# Patient Record
Sex: Female | Born: 1985 | Race: Black or African American | Hispanic: No | Marital: Married | Smoking: Never smoker
Health system: Southern US, Community
[De-identification: ages and names within clinical notes are randomized; demographics above are authoritative.]

## PROBLEM LIST (undated history)

## (undated) DIAGNOSIS — Z9109 Other allergy status, other than to drugs and biological substances: Secondary | ICD-10-CM

## (undated) DIAGNOSIS — D649 Anemia, unspecified: Secondary | ICD-10-CM

## (undated) DIAGNOSIS — T8040XA Rh incompatibility reaction due to transfusion of blood or blood products, unspecified, initial encounter: Secondary | ICD-10-CM

## (undated) DIAGNOSIS — O139 Gestational [pregnancy-induced] hypertension without significant proteinuria, unspecified trimester: Secondary | ICD-10-CM

## (undated) DIAGNOSIS — I8390 Asymptomatic varicose veins of unspecified lower extremity: Secondary | ICD-10-CM

## (undated) DIAGNOSIS — Z3182 Encounter for Rh incompatibility status: Secondary | ICD-10-CM

## (undated) HISTORY — DX: Rh incompatibility reaction due to transfusion of blood or blood products, unspecified, initial encounter: T80.40XA

## (undated) HISTORY — DX: Asymptomatic varicose veins of unspecified lower extremity: I83.90

## (undated) HISTORY — DX: Encounter for Rh incompatibility status: Z31.82

---
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
## 2005-02-26 ENCOUNTER — Ambulatory Visit: Payer: Self-pay | Admitting: Family Medicine

## 2005-03-11 ENCOUNTER — Ambulatory Visit: Payer: Self-pay | Admitting: Family Medicine

## 2005-03-22 ENCOUNTER — Encounter (INDEPENDENT_AMBULATORY_CARE_PROVIDER_SITE_OTHER): Payer: Self-pay | Admitting: *Deleted

## 2005-03-24 ENCOUNTER — Encounter (INDEPENDENT_AMBULATORY_CARE_PROVIDER_SITE_OTHER): Payer: Self-pay | Admitting: Family Medicine

## 2005-03-26 ENCOUNTER — Ambulatory Visit: Payer: Self-pay | Admitting: Family Medicine

## 2005-03-26 ENCOUNTER — Encounter (INDEPENDENT_AMBULATORY_CARE_PROVIDER_SITE_OTHER): Payer: Self-pay | Admitting: Family Medicine

## 2005-03-26 ENCOUNTER — Other Ambulatory Visit: Admission: RE | Admit: 2005-03-26 | Discharge: 2005-03-26 | Payer: Self-pay | Admitting: Family Medicine

## 2005-04-16 ENCOUNTER — Ambulatory Visit: Payer: Self-pay | Admitting: Family Medicine

## 2005-05-29 ENCOUNTER — Emergency Department (HOSPITAL_COMMUNITY): Admission: EM | Admit: 2005-05-29 | Discharge: 2005-05-29 | Payer: Self-pay | Admitting: Emergency Medicine

## 2005-06-19 ENCOUNTER — Ambulatory Visit: Payer: Self-pay | Admitting: Sports Medicine

## 2005-08-20 ENCOUNTER — Ambulatory Visit: Payer: Self-pay | Admitting: Family Medicine

## 2005-10-10 ENCOUNTER — Ambulatory Visit: Payer: Self-pay | Admitting: Sports Medicine

## 2006-01-26 ENCOUNTER — Ambulatory Visit: Payer: Self-pay | Admitting: Family Medicine

## 2006-01-29 ENCOUNTER — Ambulatory Visit (HOSPITAL_COMMUNITY): Admission: RE | Admit: 2006-01-29 | Discharge: 2006-01-29 | Payer: Self-pay | Admitting: Family Medicine

## 2006-02-04 ENCOUNTER — Ambulatory Visit: Payer: Self-pay | Admitting: Family Medicine

## 2006-03-18 ENCOUNTER — Ambulatory Visit: Payer: Self-pay | Admitting: Family Medicine

## 2006-04-22 ENCOUNTER — Ambulatory Visit (HOSPITAL_COMMUNITY): Admission: RE | Admit: 2006-04-22 | Discharge: 2006-04-22 | Payer: Self-pay | Admitting: Family Medicine

## 2006-05-06 ENCOUNTER — Ambulatory Visit: Payer: Self-pay | Admitting: Family Medicine

## 2006-05-20 ENCOUNTER — Ambulatory Visit: Payer: Self-pay | Admitting: Family Medicine

## 2006-06-03 ENCOUNTER — Ambulatory Visit: Payer: Self-pay | Admitting: Family Medicine

## 2006-06-11 ENCOUNTER — Ambulatory Visit: Payer: Self-pay | Admitting: Sports Medicine

## 2006-06-15 ENCOUNTER — Ambulatory Visit: Payer: Self-pay | Admitting: Family Medicine

## 2006-07-08 ENCOUNTER — Ambulatory Visit: Payer: Self-pay | Admitting: Family Medicine

## 2006-07-27 ENCOUNTER — Inpatient Hospital Stay (HOSPITAL_COMMUNITY): Admission: AD | Admit: 2006-07-27 | Discharge: 2006-07-28 | Payer: Self-pay | Admitting: Obstetrics & Gynecology

## 2006-07-27 ENCOUNTER — Ambulatory Visit: Payer: Self-pay | Admitting: Certified Nurse Midwife

## 2006-07-29 ENCOUNTER — Ambulatory Visit: Payer: Self-pay | Admitting: Family Medicine

## 2006-08-19 ENCOUNTER — Ambulatory Visit: Payer: Self-pay | Admitting: Family Medicine

## 2006-08-19 ENCOUNTER — Inpatient Hospital Stay (HOSPITAL_COMMUNITY): Admission: AD | Admit: 2006-08-19 | Discharge: 2006-08-19 | Payer: Self-pay | Admitting: Gynecology

## 2006-08-26 ENCOUNTER — Ambulatory Visit: Payer: Self-pay | Admitting: Gynecology

## 2006-08-26 ENCOUNTER — Ambulatory Visit: Payer: Self-pay | Admitting: Family Medicine

## 2006-08-27 ENCOUNTER — Ambulatory Visit: Payer: Self-pay | Admitting: Family Medicine

## 2006-08-31 ENCOUNTER — Ambulatory Visit: Payer: Self-pay | Admitting: Gynecology

## 2006-08-31 ENCOUNTER — Inpatient Hospital Stay (HOSPITAL_COMMUNITY): Admission: AD | Admit: 2006-08-31 | Discharge: 2006-09-04 | Payer: Self-pay | Admitting: Obstetrics & Gynecology

## 2006-08-31 ENCOUNTER — Ambulatory Visit: Payer: Self-pay | Admitting: Obstetrics & Gynecology

## 2006-09-14 ENCOUNTER — Ambulatory Visit: Payer: Self-pay | Admitting: Family Medicine

## 2006-09-17 ENCOUNTER — Inpatient Hospital Stay (HOSPITAL_COMMUNITY): Admission: AD | Admit: 2006-09-17 | Discharge: 2006-09-17 | Payer: Self-pay | Admitting: Family Medicine

## 2006-09-17 ENCOUNTER — Ambulatory Visit: Payer: Self-pay | Admitting: *Deleted

## 2006-09-24 ENCOUNTER — Ambulatory Visit: Payer: Self-pay | Admitting: Family Medicine

## 2006-10-01 ENCOUNTER — Ambulatory Visit: Payer: Self-pay | Admitting: Family Medicine

## 2006-10-05 ENCOUNTER — Ambulatory Visit: Payer: Self-pay | Admitting: Family Medicine

## 2006-10-16 ENCOUNTER — Ambulatory Visit: Payer: Self-pay | Admitting: Family Medicine

## 2006-11-06 ENCOUNTER — Ambulatory Visit: Payer: Self-pay | Admitting: Family Medicine

## 2007-01-25 ENCOUNTER — Ambulatory Visit: Payer: Self-pay | Admitting: Family Medicine

## 2007-02-18 DIAGNOSIS — J45909 Unspecified asthma, uncomplicated: Secondary | ICD-10-CM | POA: Insufficient documentation

## 2007-02-19 ENCOUNTER — Encounter (INDEPENDENT_AMBULATORY_CARE_PROVIDER_SITE_OTHER): Payer: Self-pay | Admitting: *Deleted

## 2007-06-09 ENCOUNTER — Telehealth: Payer: Self-pay | Admitting: *Deleted

## 2007-06-14 ENCOUNTER — Telehealth (INDEPENDENT_AMBULATORY_CARE_PROVIDER_SITE_OTHER): Payer: Self-pay | Admitting: *Deleted

## 2007-06-15 ENCOUNTER — Ambulatory Visit: Payer: Self-pay | Admitting: Sports Medicine

## 2007-06-15 ENCOUNTER — Encounter (INDEPENDENT_AMBULATORY_CARE_PROVIDER_SITE_OTHER): Payer: Self-pay | Admitting: Family Medicine

## 2007-06-17 ENCOUNTER — Telehealth: Payer: Self-pay | Admitting: *Deleted

## 2007-06-24 ENCOUNTER — Ambulatory Visit: Payer: Self-pay | Admitting: Family Medicine

## 2007-06-24 ENCOUNTER — Telehealth: Payer: Self-pay | Admitting: *Deleted

## 2007-08-01 ENCOUNTER — Emergency Department (HOSPITAL_COMMUNITY): Admission: EM | Admit: 2007-08-01 | Discharge: 2007-08-01 | Payer: Self-pay | Admitting: Emergency Medicine

## 2007-11-04 ENCOUNTER — Encounter (INDEPENDENT_AMBULATORY_CARE_PROVIDER_SITE_OTHER): Payer: Self-pay | Admitting: Family Medicine

## 2008-04-14 ENCOUNTER — Ambulatory Visit: Payer: Self-pay | Admitting: Family Medicine

## 2008-04-14 ENCOUNTER — Encounter (INDEPENDENT_AMBULATORY_CARE_PROVIDER_SITE_OTHER): Payer: Self-pay | Admitting: Family Medicine

## 2008-04-14 LAB — CONVERTED CEMR LAB
Chlamydia, DNA Probe: NEGATIVE
Platelets: 275 10*3/uL (ref 150–400)
RBC: 5.12 M/uL — ABNORMAL HIGH (ref 3.87–5.11)
WBC: 4.4 10*3/uL (ref 4.0–10.5)
Whiff Test: NEGATIVE

## 2008-05-12 ENCOUNTER — Encounter (INDEPENDENT_AMBULATORY_CARE_PROVIDER_SITE_OTHER): Payer: Self-pay | Admitting: *Deleted

## 2008-08-23 ENCOUNTER — Telehealth: Payer: Self-pay | Admitting: *Deleted

## 2008-09-04 ENCOUNTER — Telehealth: Payer: Self-pay | Admitting: *Deleted

## 2008-09-06 ENCOUNTER — Encounter: Payer: Self-pay | Admitting: Family Medicine

## 2008-09-06 ENCOUNTER — Ambulatory Visit: Payer: Self-pay | Admitting: Family Medicine

## 2008-09-06 LAB — CONVERTED CEMR LAB
Basophils Absolute: 0 10*3/uL (ref 0.0–0.1)
Beta hcg, urine, semiquantitative: POSITIVE
Hemoglobin: 12.3 g/dL (ref 12.0–15.0)
Lymphocytes Relative: 30 % (ref 12–46)
Monocytes Absolute: 0.5 10*3/uL (ref 0.1–1.0)
Monocytes Relative: 6 % (ref 3–12)
Neutro Abs: 4.7 10*3/uL (ref 1.7–7.7)
Neutrophils Relative %: 62 % (ref 43–77)
RBC: 4.76 M/uL (ref 3.87–5.11)
RDW: 15.2 % (ref 11.5–15.5)
Rubella: 43 intl units/mL — ABNORMAL HIGH
Sickle Cell Screen: NEGATIVE

## 2008-09-07 ENCOUNTER — Encounter: Payer: Self-pay | Admitting: Family Medicine

## 2008-09-27 ENCOUNTER — Encounter: Payer: Self-pay | Admitting: Family Medicine

## 2008-09-27 ENCOUNTER — Ambulatory Visit: Payer: Self-pay | Admitting: Family Medicine

## 2008-09-27 LAB — CONVERTED CEMR LAB: Glucose, Urine, Semiquant: NEGATIVE

## 2008-09-29 ENCOUNTER — Ambulatory Visit (HOSPITAL_COMMUNITY): Admission: RE | Admit: 2008-09-29 | Discharge: 2008-09-29 | Payer: Self-pay | Admitting: Family Medicine

## 2008-09-29 ENCOUNTER — Encounter: Payer: Self-pay | Admitting: Family Medicine

## 2008-09-29 LAB — CONVERTED CEMR LAB
Chlamydia, DNA Probe: NEGATIVE
GC Probe Amp, Genital: NEGATIVE

## 2008-10-03 ENCOUNTER — Encounter: Payer: Self-pay | Admitting: Family Medicine

## 2008-10-09 ENCOUNTER — Encounter (INDEPENDENT_AMBULATORY_CARE_PROVIDER_SITE_OTHER): Payer: Self-pay | Admitting: *Deleted

## 2008-10-09 ENCOUNTER — Ambulatory Visit: Payer: Self-pay | Admitting: Family Medicine

## 2008-10-10 ENCOUNTER — Ambulatory Visit: Payer: Self-pay | Admitting: Family Medicine

## 2008-10-10 ENCOUNTER — Telehealth: Payer: Self-pay | Admitting: Family Medicine

## 2008-10-11 ENCOUNTER — Ambulatory Visit: Payer: Self-pay | Admitting: Family Medicine

## 2008-10-19 ENCOUNTER — Encounter: Payer: Self-pay | Admitting: Family Medicine

## 2008-10-25 ENCOUNTER — Ambulatory Visit: Payer: Self-pay | Admitting: Family Medicine

## 2008-10-25 LAB — CONVERTED CEMR LAB
Glucose, Urine, Semiquant: NEGATIVE
Protein, U semiquant: NEGATIVE

## 2008-11-03 ENCOUNTER — Ambulatory Visit (HOSPITAL_COMMUNITY): Admission: RE | Admit: 2008-11-03 | Discharge: 2008-11-03 | Payer: Self-pay | Admitting: Family Medicine

## 2008-11-03 ENCOUNTER — Encounter: Payer: Self-pay | Admitting: Sports Medicine

## 2008-11-29 ENCOUNTER — Ambulatory Visit: Payer: Self-pay | Admitting: Family Medicine

## 2008-11-29 ENCOUNTER — Encounter: Payer: Self-pay | Admitting: Family Medicine

## 2008-12-07 ENCOUNTER — Telehealth: Payer: Self-pay | Admitting: *Deleted

## 2008-12-19 ENCOUNTER — Telehealth: Payer: Self-pay | Admitting: *Deleted

## 2009-01-02 ENCOUNTER — Encounter: Payer: Self-pay | Admitting: Family Medicine

## 2009-01-02 ENCOUNTER — Ambulatory Visit: Payer: Self-pay | Admitting: Family Medicine

## 2009-01-02 ENCOUNTER — Telehealth: Payer: Self-pay | Admitting: *Deleted

## 2009-01-02 ENCOUNTER — Encounter: Payer: Self-pay | Admitting: Sports Medicine

## 2009-01-02 LAB — CONVERTED CEMR LAB
Antibody Screen: NEGATIVE
Hemoglobin: 11.3 g/dL — ABNORMAL LOW (ref 12.0–15.0)
MCHC: 33.5 g/dL (ref 30.0–36.0)
Platelets: 224 10*3/uL (ref 150–400)
RDW: 13.5 % (ref 11.5–15.5)

## 2009-01-10 ENCOUNTER — Telehealth: Payer: Self-pay | Admitting: Sports Medicine

## 2009-01-13 ENCOUNTER — Emergency Department (HOSPITAL_COMMUNITY): Admission: EM | Admit: 2009-01-13 | Discharge: 2009-01-13 | Payer: Self-pay | Admitting: Emergency Medicine

## 2009-01-16 ENCOUNTER — Telehealth: Payer: Self-pay | Admitting: Sports Medicine

## 2009-02-02 ENCOUNTER — Encounter: Payer: Self-pay | Admitting: Family Medicine

## 2009-02-02 ENCOUNTER — Ambulatory Visit: Payer: Self-pay | Admitting: Family Medicine

## 2009-02-16 ENCOUNTER — Ambulatory Visit: Payer: Self-pay | Admitting: Family Medicine

## 2009-03-02 ENCOUNTER — Encounter (INDEPENDENT_AMBULATORY_CARE_PROVIDER_SITE_OTHER): Payer: Self-pay | Admitting: Family Medicine

## 2009-03-02 ENCOUNTER — Ambulatory Visit: Payer: Self-pay | Admitting: Family Medicine

## 2009-03-12 ENCOUNTER — Ambulatory Visit: Payer: Self-pay | Admitting: Family Medicine

## 2009-03-12 ENCOUNTER — Encounter: Payer: Self-pay | Admitting: Family Medicine

## 2009-03-21 ENCOUNTER — Ambulatory Visit: Payer: Self-pay | Admitting: Family Medicine

## 2009-03-29 ENCOUNTER — Ambulatory Visit: Payer: Self-pay | Admitting: Family Medicine

## 2009-03-30 ENCOUNTER — Ambulatory Visit: Payer: Self-pay | Admitting: Obstetrics & Gynecology

## 2009-03-30 ENCOUNTER — Inpatient Hospital Stay (HOSPITAL_COMMUNITY): Admission: AD | Admit: 2009-03-30 | Discharge: 2009-04-02 | Payer: Self-pay | Admitting: Obstetrics & Gynecology

## 2009-04-16 ENCOUNTER — Telehealth: Payer: Self-pay | Admitting: *Deleted

## 2009-04-25 ENCOUNTER — Ambulatory Visit: Payer: Self-pay | Admitting: Family Medicine

## 2009-04-25 ENCOUNTER — Encounter: Payer: Self-pay | Admitting: Family Medicine

## 2009-05-05 IMAGING — US US OB DETAIL+14 WK
1 series · 8 of 8 positions shown · non-contrast
Comparison: none

OBSTETRICAL ULTRASOUND:
 This ultrasound exam was performed in the [HOSPITAL] Ultrasound Department.  The OB US report was generated in the AS system, and faxed to the ordering physician.  This report is also available in [REDACTED] PACS.

[Series 1: us ob detail+14 wk · 0.12mm/px · 8 of 8 slices shown]
[im 1/8]
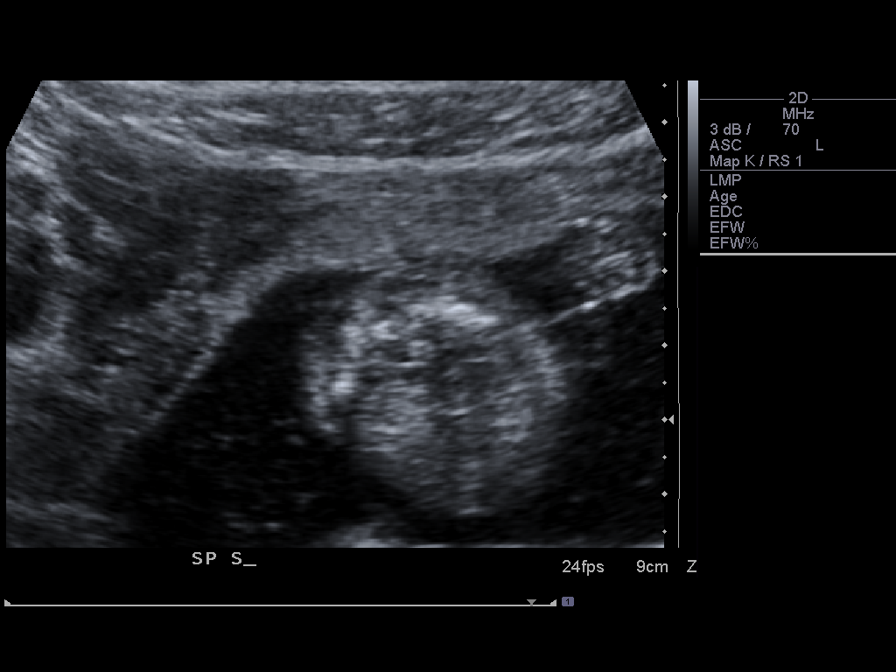
[im 2/8]
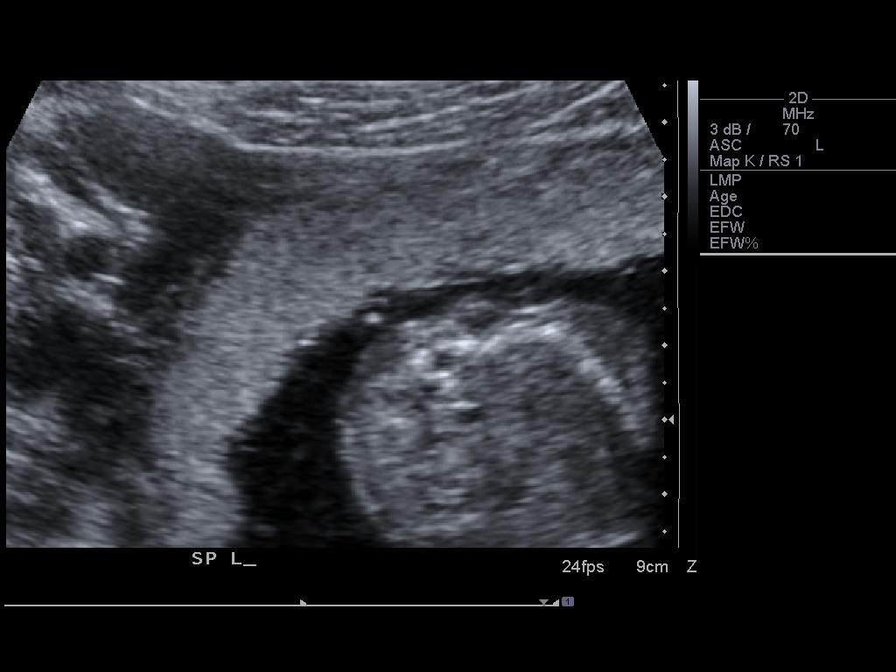
[im 3/8]
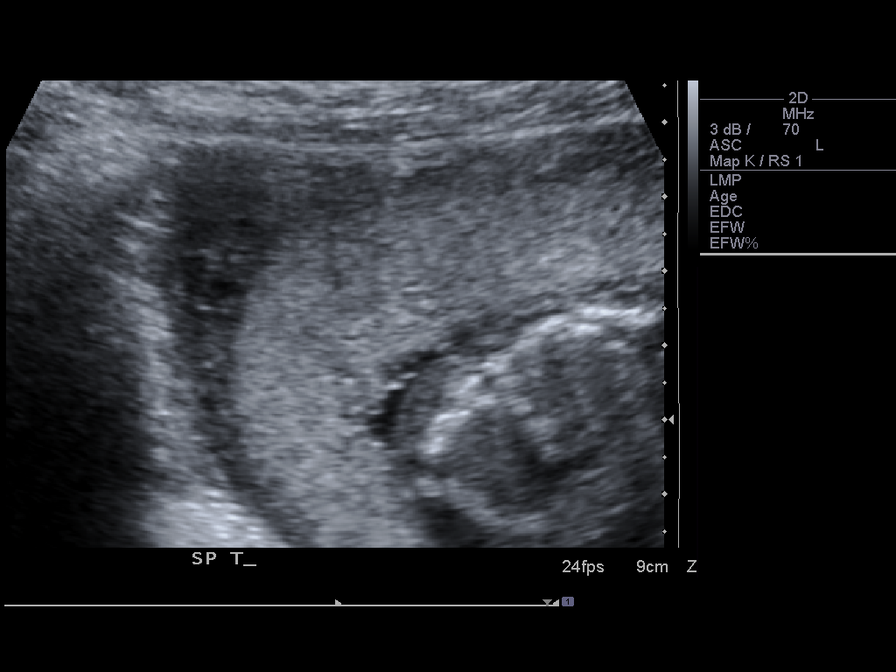
[im 4/8]
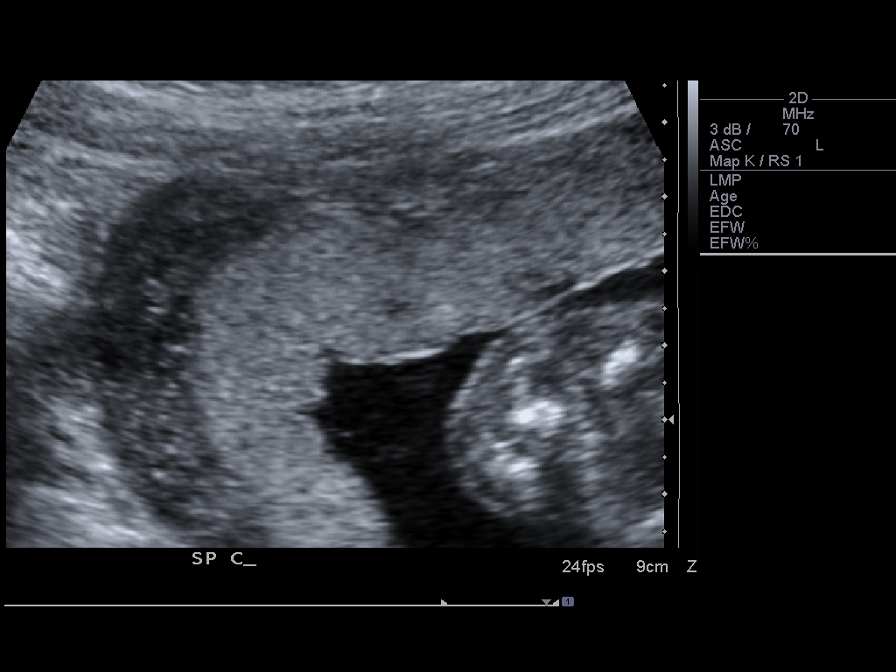
[im 5/8]
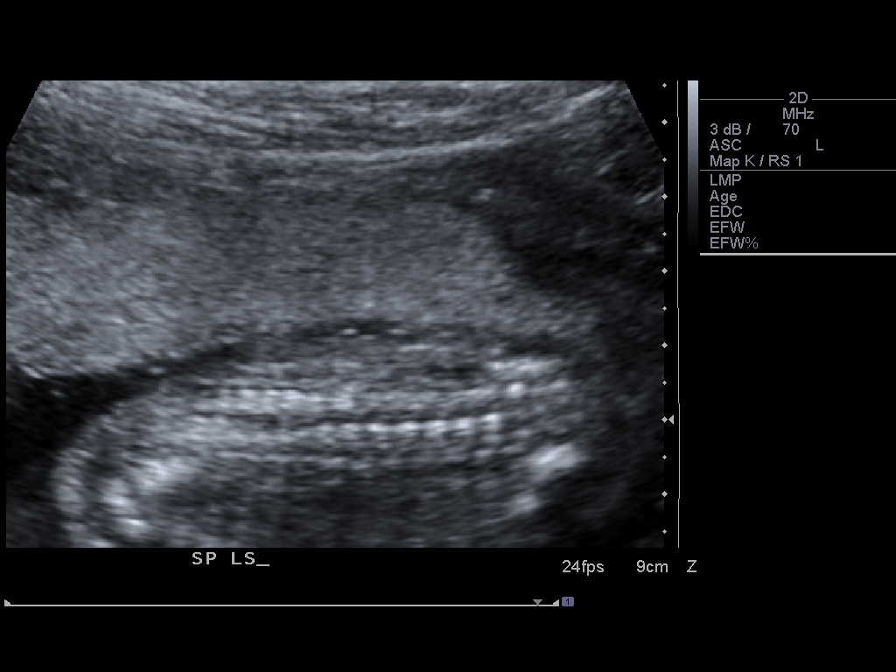
[im 6/8]
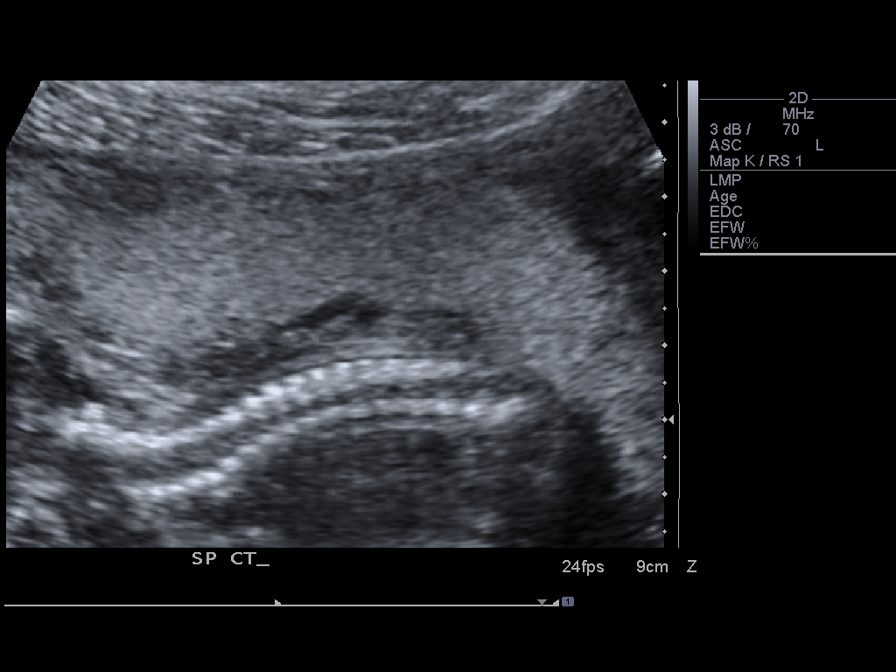
[im 7/8]
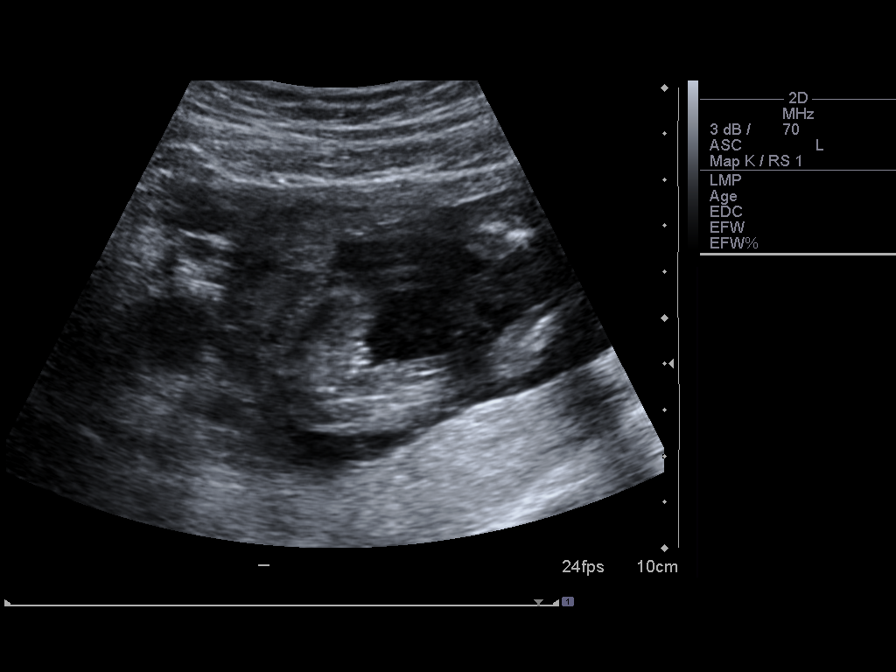
[im 8/8]
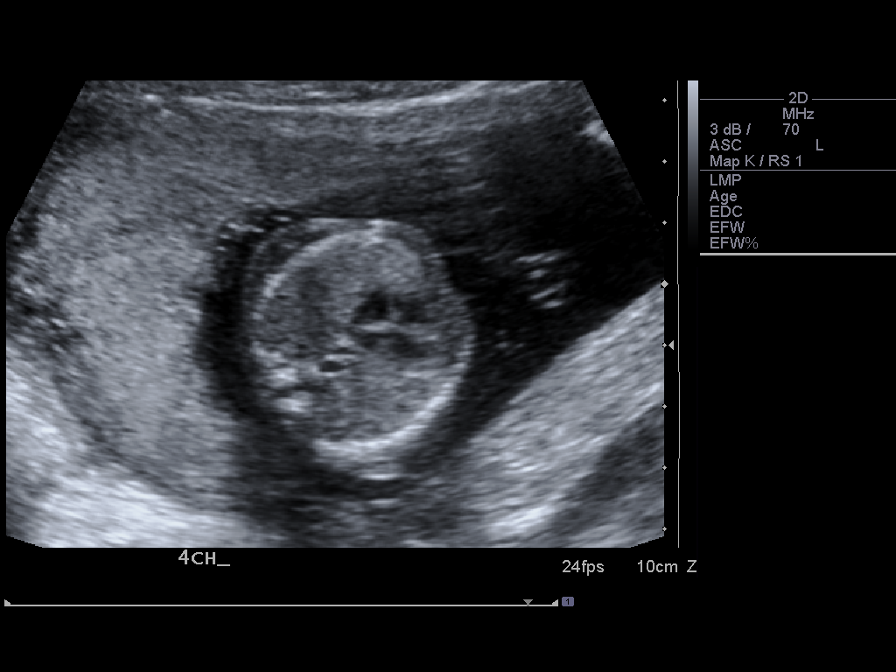

[8 of 8 positions shown; findings below may reference images not displayed]

IMPRESSION: See AS Obstetric US report.

## 2009-05-17 ENCOUNTER — Ambulatory Visit: Payer: Self-pay | Admitting: Family Medicine

## 2009-05-17 ENCOUNTER — Encounter: Payer: Self-pay | Admitting: Family Medicine

## 2009-06-22 ENCOUNTER — Ambulatory Visit: Payer: Self-pay | Admitting: Family Medicine

## 2009-08-20 ENCOUNTER — Ambulatory Visit: Payer: Self-pay | Admitting: Family Medicine

## 2009-08-20 DIAGNOSIS — E669 Obesity, unspecified: Secondary | ICD-10-CM | POA: Insufficient documentation

## 2009-11-07 ENCOUNTER — Ambulatory Visit: Payer: Self-pay | Admitting: Family Medicine

## 2009-12-18 ENCOUNTER — Telehealth: Payer: Self-pay | Admitting: *Deleted

## 2009-12-22 ENCOUNTER — Emergency Department (HOSPITAL_COMMUNITY): Admission: EM | Admit: 2009-12-22 | Discharge: 2009-12-22 | Payer: Self-pay | Admitting: Emergency Medicine

## 2010-03-05 ENCOUNTER — Telehealth: Payer: Self-pay | Admitting: Family Medicine

## 2010-03-05 ENCOUNTER — Ambulatory Visit: Payer: Self-pay | Admitting: Family Medicine

## 2010-03-05 ENCOUNTER — Encounter: Payer: Self-pay | Admitting: Family Medicine

## 2010-03-05 LAB — CONVERTED CEMR LAB
Beta hcg, urine, semiquantitative: NEGATIVE
Chlamydia, DNA Probe: NEGATIVE
GC Probe Amp, Genital: NEGATIVE
Whiff Test: NEGATIVE

## 2010-04-10 ENCOUNTER — Ambulatory Visit: Payer: Self-pay | Admitting: Family Medicine

## 2010-04-10 LAB — CONVERTED CEMR LAB: Beta hcg, urine, semiquantitative: NEGATIVE

## 2010-05-27 ENCOUNTER — Encounter: Payer: Self-pay | Admitting: Family Medicine

## 2010-07-06 ENCOUNTER — Emergency Department (HOSPITAL_COMMUNITY): Admission: EM | Admit: 2010-07-06 | Discharge: 2010-07-06 | Payer: Self-pay | Admitting: Family Medicine

## 2010-10-28 ENCOUNTER — Ambulatory Visit: Payer: Self-pay | Admitting: Family Medicine

## 2010-10-28 ENCOUNTER — Encounter: Payer: Self-pay | Admitting: Sports Medicine

## 2010-10-28 DIAGNOSIS — L919 Hypertrophic disorder of the skin, unspecified: Secondary | ICD-10-CM

## 2010-10-28 DIAGNOSIS — N760 Acute vaginitis: Secondary | ICD-10-CM | POA: Insufficient documentation

## 2010-10-28 DIAGNOSIS — L909 Atrophic disorder of skin, unspecified: Secondary | ICD-10-CM | POA: Insufficient documentation

## 2010-10-28 LAB — CONVERTED CEMR LAB
Chlamydia, DNA Probe: NEGATIVE
GC Probe Amp, Genital: NEGATIVE
Pap Smear: NEGATIVE

## 2010-10-30 ENCOUNTER — Encounter: Admission: RE | Admit: 2010-10-30 | Discharge: 2010-10-30 | Payer: Self-pay | Admitting: Sports Medicine

## 2010-12-22 NOTE — L&D Delivery Note (Signed)
Delivery Note At 1:50 PM a viable female was delivered via Vaginal, Spontaneous Delivery (Presentation: ;ROA  ).  APGAR: 7, 8; weight 10 lb 1.4 oz (4576 g).   Placenta status: Delivered in tact .  Cord:  with the following complications: .    Anesthesia: Epidural  Episiotomy: None Lacerations: None Est. Blood Loss (mL): 800  Pt with uterine atony and bleeding.  Blood clots removed manually from uterus, given extra 10 miliunits pitocin, and misoprostol 1000 mg per rectum.   Mom to AICU for Mag x 24 more hours.  Baby to nursery-stable.  Erica [name] 09/17/2011, 2:22 PM

## 2011-01-12 ENCOUNTER — Encounter: Payer: Self-pay | Admitting: *Deleted

## 2011-01-21 NOTE — Miscellaneous (Signed)
Summary: Chart Summary  Clinical Lists Changes  Observations: Added new observation of PMH REVIEWED: reviewed - no changes required (05/27/2010 16:07)      Past Medical History:    Reviewed history from 04/18/2010 and no changes required:       Recurrent BV       Trich 10/2004       Mirena IUD placed in 2010.  Larey Seat out, replaced 02/2010.  Second Mirena fell out and removed 03/2010.

## 2011-01-21 NOTE — Assessment & Plan Note (Signed)
Summary: CHECK IUD/KH   Vital Signs:  Patient profile:   25 year old female Height:      64 inches Weight:      210.3 pounds BMI:     36.23 Temp:     98.4 degrees F oral Pulse rate:   71 / minute BP sitting:   113 / 79  (left arm) Cuff size:   large  Vitals Entered By: Garen Grams LPN (April 10, 2010 8:41 AM) CC: Check IUD Is Patient Diabetic? No Pain Assessment Patient in pain? no        Primary Care Provider:  Romero Belling MD  CC:  Check IUD.  History of Present Illness: Mirena IUD removed and replaced with new one on 03/15.  Presents today for string check.  Complains of persistent vaginal spotting since placement.  Habits & Providers  Alcohol-Tobacco-Diet     Tobacco Status: never  Allergies (verified): No Known Drug Allergies  Physical Exam  General:  Well-developed,well-nourished,in no acute distress; alert,appropriate and cooperative throughout examination Genitalia:  Pelvic Exam:        External: normal female genitalia without lesions or masses        Vagina: normal without lesions or masses        Cervix: normal without lesions or masses, Mirena IUD protruding several mm from cervical os        Adnexa: normal bimanual exam without masses or fullness        Uterus: normal by palpation        Pap smear: not performed Additional Exam:  PROCEDURE NOTE:  IUD REMOVAL  - Verbal consent obtained.  - Speculum placed in vagina.  - Mirena IUD strings and first several mm of Mirena body visualized protruding from cervical os.  - Ring forceps applied to strings and Mirena removed without complications.  - Patient tolerated procedure well.  - Scant spotting.   Impression & Recommendations:  Problem # 1:  INTRAUTERINE CONTRACEPTIVE DEVICE (ICD-V25.1) Assessment Deteriorated IUD removed today.  This is the second time one has fallen out from her uterus.  Problem # 2:  CONTRACEPTIVE MANAGEMENT (ICD-V25.09) Assessment: Deteriorated  Patient declines Depo  and Implanon today.  Elects to for OCPs.  Negative UPT in clinic.  Patient left while results were pending, but was phoned and informed.  OCP prescription sent.  Orders: IUD removal -[facility] ([id number])  Complete Medication List: 1)  Advair Diskus 250-50 Mcg/dose Misc (Fluticasone-salmeterol) .Marland Kitchen.. 1 puff inhaled two times a day.  dispense 1 disk. 2)  P D Natal Vitamins/folic Acid Tabs (Prenatal multivit-min-fe-fa) .... Take one by mouth daily 3)  Ventolin Hfa 108 (90 Base) Mcg/act Aers (Albuterol sulfate) .Marland Kitchen.. 1-2 puffs inhaled q4h as needed for shortness of breath or wheeze.  dispense 1 4)  Ortho Tri-cyclen (28) 0.18/0.215/0.25 Mg-35 Mcg Tabs (Norgestim-eth estrad triphasic) .Marland Kitchen.. 1 tab by mouth daily for birth control.  disp #1 pack.  Other Orders: U Preg-[facility] ([id number])  Patient Instructions: 1)  Birth control pill prescription sent to [facility]. 2)  I'll call with urine pregnancy results. Prescriptions: ORTHO TRI-CYCLEN (28) 0.18/0.215/0.25 MG-35 MCG TABS (NORGESTIM-ETH ESTRAD TRIPHASIC) 1 tab by mouth daily for birth control.  Disp #1 pack.  #1 x 6   Entered and Authorized by:   Romero Belling MD   Signed by:   Romero Belling MD on 04/10/2010   Method used:   Electronically to        [facility]* (retail)  [address] [place], [place]  [address]       Ph: [phone]       Fax: [phone]   RxID:   [id number] ORTHO TRI-CYCLEN (28) 0.18/0.215/0.25 MG-35 MCG TABS (NORGESTIM-ETH ESTRAD TRIPHASIC) 1 tab by mouth daily for birth control.  Disp #1 pack.  #1 x 6   Entered and Authorized by:   Romero Belling MD   Signed by:   Romero Belling MD on 04/10/2010   Method used:   Electronically to        [facility] [phone]* (retail)       [address]       [place], [place]  [address]       Ph: [phone] or [phone]       Fax: [phone]   RxID:   [id number]   Laboratory Results   Urine  Tests  Date/Time Received: April 10, 2010 9:51 AM  Date/Time Reported: April 10, 2010 10:31 AM     Urine HCG: negative Comments: ...............test performed by......Marland KitchenBonnie A. [place]azil[place], MLS (ASCP)cm     Appended Document: CHECK IUD/KH    Clinical Lists Changes  Problems: Removed problem of INTRAUTERINE CONTRACEPTIVE DEVICE (ICD-V25.1) - Mirena IUD Placed 05/17/2009 by Dr. Constance Goltz at [facility]. Removed problem of VAGINITIS, BACTERIAL, RECURRENT (ICD-616.10) Observations: Added new observation of PAST MED HX: Recurrent BV Trich 10/2004 Mirena IUD placed in 2010.  Larey Seat out, replaced 02/2010.  Second Mirena fell out and removed 03/2010. (04/18/2010 8:37)       Past Medical History:    Recurrent BV    Trich 10/2004    Mirena IUD placed in 2010.  Larey Seat out, replaced 02/2010.  Second Mirena fell out and removed 03/2010.    Allergies: No Known Drug Allergies   Past History:  Past Medical History: Recurrent BV Trich 10/2004 Mirena IUD placed in 2010.  Larey Seat out, replaced 02/2010.  Second Mirena fell out and removed 03/2010.

## 2011-01-21 NOTE — Progress Notes (Signed)
 Summary: triage  Phone Note Call from Patient Call back at Home Phone [phone]   Caller: Patient Summary of Call: [redacted] wks pregnant, has questions about pressure in lower pelvic area. Initial call taken by: KARNA SEMINOLE,  January 10, 2009 2:14 PM  Follow-up for Phone Call        c/o pressure on her pelvic bone when she stands for a long time. denies vag d/c, pain or contractions. 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 advised taking frequent breaks to sit or lay down. gets 15 minutes twice a day & a 30 minute lunch. voiding more often. told her this was normal as she grows & presses on mom's bones, bladder & nerves. she does not think they will allow her to have a stool to perch on when not ringing items up. Follow-up by: GINNIE MAU RN,  January 10, 2009 2:25 PM  Additional Follow-up for Phone Call Additional follow up Details #1::        Good responses, also tell her if she doesn't feel the baby moving, if she starts contracting, if she has any fluid leakage, or if she has any vaginal bleeding to go straight to the MAU for assessment. Otherwise follow up as previously scheduled for her next OB visit in February. Additional Follow-up by: DEBBY PETTIES MD,  January 10, 2009 6:49 PM

## 2011-01-21 NOTE — Assessment & Plan Note (Signed)
Summary: Vaginal Discharge   Vital Signs:  Patient profile:   25 year old female Height:      64 inches Weight:      210 pounds BMI:     36.18 BSA:     2.00 Temp:     98.2 degrees F Pulse rate:   73 / minute BP sitting:   116 / 74  Vitals Entered By: Jone Baseman CMA (March 05, 2010 10:33 AM) CC: Vaginal Discharge Is Patient Diabetic? No Pain Assessment Patient in pain? no        Primary Care Provider:  Romero Belling MD  CC:  Vaginal Discharge.  History of Present Illness: 25 yo female presenting with 1 day history of vaginal discharge which is pruritic and malodorous.  She denies abdominal pain and fever.  She has a history of frequent bacterial vaginosis infections, and has had trichamonas on 2 episodes in the past, the most recent of which was in January 2011.  She was treated appropriately for all of these episodes.  On speculum examination this morning, she was noted to have a Mirena IUD protruding from the cervical os.  This IUD was placed by me in 04/2009 at approximately 6 weeks postpartum and its placement was confirmed twice by speculum examination--once by me and once by another physician in this practice.  She reports finishing a normal period 2 days ago, but did have unprotected sexual intercourse approximately 30-32 hours prior to presentation this morning.  Pregnancy test is negative in clinic today.  Wet prep is positive for bacterial vaginosis but negative for trichamonas and yeast.  She desires to have the IUD removed and replaced today.  She also desires to have emergency contraception provided to her.  Habits & Providers  Alcohol-Tobacco-Diet     Tobacco Status: never  Allergies (verified): No Known Drug Allergies  Physical Exam  General:  Well-developed,well-nourished,in no acute distress; alert,appropriate and cooperative throughout examination Abdomen:  Bowel sounds positive,abdomen soft and non-tender without masses, organomegaly or hernias  noted. Genitalia:  Pelvic Exam:        External: normal female genitalia without lesions or masses        Vagina: normal without lesions or masses, + thick white discharge        Cervix: normal without lesions or masses, Mirena IUD protruding 0.5 cm from os        Adnexa: normal bimanual exam without masses or fullness        Uterus: normal by palpation.        Pap smear: not performed Additional Exam:  PROCEDURE NOTE:  REMOVAL OF IUD (Performed by Romero Belling, MD)  - Ring forceps applied to visible portion of IUD.  - IUD withdrawn without complications.  PROCEDURE NOTE:  IUD INSERTION (Performed by Sarah Swazil[place], MD)  - Verbal informed consent obtained.  - Betadine x3 swabs applied to cervix.  - Tenaculum applied to posterior cervix.  - Cervix sounded to 6 cm.  - Mirena IUD placed in usual sterile fashion with manufacturer's insertion device.   - Strings trimmed to 4 cm.  - Patient tolerated procedure well, though complained of some cramping.  - Ibuprofen 400 mg by mouth x1 given.  Agree with exam above.  Uterus noted to be retroflexed.  No purulent discharge from os.  No CMT or adnexal TTP.  IUD placed without difficulty.  Sarah Swaziland MD  March 06, 2010 10:04 AM     Impression & Recommendations:  Problem # 1:  VAGINITIS, BACTERIAL, RECURRENT (ICD-616.10) Assessment Deteriorated Metronidazole 500 mg by mouth two times a day x7 days, then Fluconazole x1 after antibiotics.  Orders: Provider Misc Charge- [facility] (Misc)  Problem # 2:  INTRAUTERINE CONTRACEPTIVE DEVICE (ICD-V25.1) Assessment: Deteriorated IUD removed and new IUD inserted today.  Negative UPT in clinic.  LMP 03/03/2010.  Last intercourse 03/04/2010.  Given Plan B and advised to take immediately. Orders: U Preg-[facility] [id number]) Provider Misc Charge- [facility] (Misc)  Complete Medication List: 1)  Advair Diskus 250-50 Mcg/dose Misc (Fluticasone-salmeterol) .Marland Kitchen.. 1 puff inhaled two times a day.  dispense 1 disk. 2)  P D Natal  Vitamins/folic Acid Tabs (Prenatal multivit-min-fe-fa) .... Take one by mouth daily 3)  Ventolin Hfa 108 (90 Base) Mcg/act Aers (Albuterol sulfate) .Marland Kitchen.. 1-2 puffs inhaled q4h as needed for shortness of breath or wheeze.  dispense 1 4)  Plan B 0.75 Mg Tabs (Levonorgestrel) .... Take 2 tabs now 5)  Metronidazole 500 Mg Tabs (Metronidazole) .Marland Kitchen.. 1 tab by mouth two times a day x7 days 6)  Fluconazole 150 Mg Tabs (Fluconazole) .Marland Kitchen.. 1 tab by mouth x1 after completing metronidazole  Other Orders: GC/Chlamydia-[facility] ([id number]) [id number]  Patient Instructions: 1)  Take both pills of Plan B immediately. 2)  You have bacterial vaginosis:  Metronidazole two times a day x1 week.  Take Fluconazole x1 dose after that. 3)  You do not have Trichamonas, but did definitely had this in January when you went to the Emergency Room. 4)  Please schedule a follow-up appointment in 1 month for IUD string check. Use condoms every time until then. Prescriptions: FLUCONAZOLE 150 MG TABS (FLUCONAZOLE) 1 tab by mouth x1 after completing Metronidazole  #1 x 0   Entered and Authorized by:   Romero Belling MD   Signed by:   Romero Belling MD on 03/05/2010   Method used:   Print then Give to Patient   RxID:   [id number] METRONIDAZOLE 500 MG TABS (METRONIDAZOLE) 1 tab by mouth two times a day x7 days  #14 x 0   Entered and Authorized by:   Romero Belling MD   Signed by:   Romero Belling MD on 03/05/2010   Method used:   Print then Give to Patient   RxID:   [id number] PLAN B 0.75 MG TABS (LEVONORGESTREL) Take 2 tabs now  #2 x 0   Entered and Authorized by:   Romero Belling MD   Signed by:   Romero Belling MD on 03/05/2010   Method used:   Print then Give to Patient   RxID:   [id number]   Laboratory Results   Urine Tests  Date/Time Received: March 05, 2010 10:54 AM  Date/Time Reported: March 05, 2010 11:12 AM     Urine HCG: negative Comments: ...............test performed  by......Marland KitchenBonnie A. [place], MLS (ASCP)cm  Date/Time Received: March 05, 2010 10:54 AM  Date/Time Reported: March 05, 2010 11:13 AM   Wet [place] Source: vag WBC/hpf: 15-25 Bacteria/hpf: 2+  Cocci Clue cells/hpf: few  Negative whiff Yeast/hpf: none Trichomonas/hpf: none Comments: occ intermediate and parabasal cell;   rare sperm ...............test performed by......Marland KitchenBonnie A. [place], MLS (ASCP)cm

## 2011-01-21 NOTE — Progress Notes (Signed)
Summary: triage  Phone Note Call from Patient Call back at Home Phone [phone] Message from:  Patient  Caller: Patient Summary of Call: Pt wondering if she can get a rx for a bacteria infection called into her pharmacy.  Pharmacy is [facility]. Initial call taken by: Clydell Hakim,  March 05, 2010 8:56 AM  Follow-up for Phone Call        a man answered the phone  & asked that I call [phone]. I then spoke with pt. she thinks she has BV. reluctant to come in as she has no insurance. has the papers for [facility] but has not finished getting them all together to take to Ms. [facility]. told her she needed to be seen. she asked how much to bring. I told her $20 towards account. she took a 10:30am appt Follow-up by: Golden Circle RN,  March 05, 2010 8:58 AM  Additional Follow-up for Phone Call Additional follow up Details #1::        I am doing cross cover this morning and can see Ms. Ewing--please schedule her for cross cover and have her see me if possible. Additional Follow-up by: Romero Belling MD,  March 05, 2010 10:12 AM    Additional Follow-up for Phone Call Additional follow up Details #2::    the CMA & LPN are aware Follow-up by: Golden Circle RN,  March 05, 2010 10:30 AM

## 2011-01-21 NOTE — Assessment & Plan Note (Signed)
Summary: physical/pap,pelvic/bmc   Vital Signs:  Patient profile:   25 year old female LMP:     10/12/2010 Weight:      211 pounds Temp:     97.6 degrees F oral Pulse rate:   93 / minute Pulse rhythm:   regular BP sitting:   117 / 87  (left arm) Cuff size:   large  Vitals Entered By: Loralee Pacas CMA (October 28, 2010 9:08 AM) CC: cpp Comments pt stated that she did a self breast exam and noticed a lump last month around her right breast, she would like for that to be checked LMP (date): 10/12/2010 LMP - Character: normal LMP - Reliable? Yes Menarche (age onset years): 12   Menses interval (days): 28 Menstrual flow (days): 7 On BCP's at conception: no Enter LMP: 10/12/2010 Last PAP Result normal   Primary Care Provider:  Ellery Plunk MD  CC:  cpp.  History of Present Illness: 25 yo female here for physical/pap, breast mass, and vag discharge.  Breast mass:  Noticed yesterday in shower.  R breast, upper outer quadrant, non-tender, new, no fam hx breast Ca. No breast discharge.  Vag discharge:  Not malodorous, no itch, no pain, no association with periods, no missed periods, no irritation.  Whitish.  Habits & Providers  Alcohol-Tobacco-Diet     Tobacco Status: never     Cigarette Packs/Day: n/a  Exercise-Depression-Behavior     Does Patient Exercise: yes     Exercise Counseling: to improve exercise regimen     Type of exercise: cardio     Exercise (avg: min/session): <30     Times/week: 3     Have you felt down or hopeless? no     Have you felt little pleasure in things? no     Depression Counseling: not indicated; screening negative for depression     Seat Belt Use: always  Current Medications (verified): 1)  Advair Diskus 250-50 Mcg/dose Misc (Fluticasone-Salmeterol) .Marland Kitchen.. 1 Puff Inhaled Two Times A Day.  Dispense 1 Disk. 2)  P D Natal Vitamins/folic Acid  Tabs (Prenatal Multivit-Min-Fe-Fa) .... Take One By Mouth Daily 3)  Ventolin Hfa 108 (90 Base)  Mcg/act Aers (Albuterol Sulfate) .Marland Kitchen.. 1-2 Puffs Inhaled Q4h As Needed For Shortness of Breath or Wheeze.  Dispense 1 4)  Ortho Tri-Cyclen (28) 0.18/0.215/0.25 Mg-35 Mcg Tabs (Norgestim-Eth Estrad Triphasic) .Marland Kitchen.. 1 Tab By Mouth Daily For [employer].  Disp #1 Pack.  Allergies (verified): No Known Drug Allergies  Social History: Risk analyst Use:  always Does Patient Exercise:  yes  Review of Systems       See HPI  Physical Exam  General:  Well-developed,well-nourished,in no acute distress; alert,appropriate and cooperative throughout examination Head:  Normocephalic and atraumatic without obvious abnormalities.  Eyes:  No corneal or conjunctival inflammation noted. EOMI. Perrla. Ears:  External ear exam shows no significant lesions or deformities.  Otoscopic examination reveals clear canals, tympanic membranes are intact bilaterally without bulging, retraction, inflammation or discharge. Hearing is grossly normal bilaterally. Nose:  External nasal examination shows no deformity or inflammation. Nasal mucosa are pink and moist without lesions or exudates. Mouth:  Oral mucosa and oropharynx without lesions or exudates.  Teeth in good repair. Neck:  No deformities, masses, or tenderness noted. Breasts:  No nodules, thickening, tenderness, bulging, retraction, inflamation, nipple discharge or skin changes noted.  She does have some prominent breast glandular tissue in the upper outer quadrant of her right breast.  Non-tender. Lungs:  Normal respiratory  effort, chest expands symmetrically. Lungs are clear to auscultation, no crackles or wheezes. Heart:  Normal rate and regular rhythm. S1 and S2 normal without gallop, murmur, click, rub or other extra sounds. Abdomen:  Bowel sounds positive,abdomen soft and non-tender without masses, organomegaly or hernias noted. Genitalia:  Normal introitus for age, no external lesions, no vaginal discharge, mucosa pink and moist, no vaginal or cervical lesions,  no vaginal atrophy, no friaility or hemorrhage, normal uterus size and position, no adnexal masses or tenderness Msk:  No deformity or scoliosis noted of thoracic or lumbar spine.   Pulses:  R and L carotid,radial,femoral,dorsalis pedis and posterior tibial pulses are full and equal bilaterally Extremities:  No clubbing, cyanosis, edema, or deformity noted with normal full range of motion of all joints.   Neurologic:  No cranial nerve deficits noted. Station and gait are normal. Plantar reflexes are down-going bilaterally. DTRs are symmetrical throughout. Sensory, motor and coordinative functions appear intact. Skin:  Skin tag below left breast at bra line.   Impression & Recommendations:  Problem # 1:  ACROCHORDON (ICD-701.9) Assessment New Pt to make appt to have this removed.  Orders: [facility] - Est  18-39 yrs [id number])  Problem # 2:  BREAST MASS, RIGHT (ICD-611.72) Assessment: New US breast.  Orders: [facility] - Est  18-39 yrs ([id number]) Ultrasound (Ultrasound)  Problem # 3:  UNSPECIFIED VAGINITIS AND VULVOVAGINITIS (ICD-616.10) Assessment: New Awaiting WP, GC/Chlam.  Orders: Pap Smear-[facility] ([id number]) Wet Prep- [facility] ([id number]) [facility] - Est  18-39 yrs ([id number]) GC/Chlamydia-[facility] ([id number])  Problem # 4:  SCREENING FOR MALIGNANT NEOPLASM OF THE CERVIX (ICD-V76.2) Assessment: Unchanged PAP done.  Orders: Pap Smear-[facility] ([id number]) [facility] - Est  18-39 yrs ([id number])  Problem # 5:  Preventive Health Care (ICD-V70.0) Assessment: Comment Only Physical normal, flu shot given.  Complete Medication List: 1)  Advair Diskus 250-50 Mcg/dose Misc (Fluticasone-salmeterol) .Marland Kitchen.. 1 puff inhaled two times a day.  dispense 1 disk. 2)  P D Natal Vitamins/folic Acid Tabs (Prenatal multivit-min-fe-fa) .... Take one by mouth daily 3)  Ventolin Hfa 108 (90 Base) Mcg/act Aers (Albuterol sulfate) .Marland Kitchen.. 1-2 puffs inhaled q4h as needed for shortness of breath or wheeze.  dispense 1 4)  Ortho Tri-cyclen (28)  0.18/0.215/0.25 Mg-35 Mcg Tabs (Norgestim-eth estrad triphasic) .Marland Kitchen.. 1 tab by mouth daily for birth control.  disp #1 pack.  Other Orders: Influenza Vaccine NON MCR ([id number])  Patient Instructions: 1)  Great to see you today, 2)  We did: 3)  PAP smear 4)  Chlamydia test 5)  Wet prep for discharge. 6)  You need to do: 7)  Breast ultrasound for mass. 8)  Make an appt to come back to see me to remove the skin tab under your left breast. 9)  -Dr. Karie Schwalbe.   Orders Added: 1)  Pap Smear-[facility] [[id number]] 2)  Wet Prep- [facility] [87210] 3)  [facility] - Est  18-39 yrs [[id number]] 4)  Ultrasound [Ultrasound] 5)  GC/Chlamydia-[facility] [[id number]] 6)  Influenza Vaccine NON MCR [[id number]]   Immunizations Administered:  Influenza Vaccine # 1:    Vaccine Type: Fluvax Non-MCR    Site: left deltoid    Mfr: GlaxoSmithKline    Dose: 0.5 ml    Route: IM    Given by: Loralee Pacas CMA    Exp. Date: 06/21/2011    Lot #: [id number]    VIS given: 07/16/10 version given October 28, 2010.  Flu Vaccine Consent Questions:    Do you have a history of severe allergic reactions to  this vaccine? no    Any prior history of allergic reactions to egg and/or gelatin? no    Do you have a sensitivity to the preservative Thimersol? no    Do you have a past history of Guillan-Barre Syndrome? no    Do you currently have an acute febrile illness? no    Have you ever had a severe reaction to latex? no    Vaccine information given and explained to patient? yes    Are you currently pregnant? no   Immunizations Administered:  Influenza Vaccine # 1:    Vaccine Type: Fluvax Non-MCR    Site: left deltoid    Mfr: GlaxoSmithKline    Dose: 0.5 ml    Route: IM    Given by: Loralee Pacas CMA    Exp. Date: 06/21/2011    Lot #: [id number]    VIS given: 07/16/10 version given October 28, 2010.     Appended Document: wet prep report    Lab Visit  Laboratory Results  Date/Time Received: October 28, 2010 9:44 AM  Date/Time  Reported: October 28, 2010 3:41 PM   [employer] Source: vag WBC/hpf: >20 Bacteria/hpf: 2+  rodes and cocci Clue cells/hpf: none  Negative whiff Yeast/hpf: none Trichomonas/hpf: none Comments: ...............test performed by......Marland KitchenBonnie A. [place], MLS (ASCP)cm   Orders Today:

## 2011-01-21 NOTE — Progress Notes (Signed)
 Summary: FYI  Phone Note Call from Patient Call back at [facility] Phone [phone]   Summary of Call: went to [place] over the weekend for vaginal bleeding, was put on prednisone  and was also told she has a bladder infection. Initial call taken by: BENTON MANSON,  January 16, 2009 8:46 AM  Follow-up for Phone Call        FYI  pt was told to make a f/u appt. she will call and schedule an appt. Follow-up by: LETITIA REUSING,  January 16, 2009 9:45 AM       Appended Document: FYI Please call and ask if the bleeding has stopped.  If it was associated with any pain or fluid leakage, contractions, or decrease in fetal movement?  If she answers yes to any of these she needs to go to [facility] to be evaluated further.    -Dr. ONEIDA.  Appended Document: FYI Pt states she went to [facility] cone for asthma. Not for vaginal bleeding. she states she forgot to mention that this was not for vaginal bleeding. The bleeding was from her rectum.

## 2011-01-21 NOTE — Miscellaneous (Signed)
 Summary: Rhophylac   Rhophylac    Imported By: BENTON MANSON 01/03/2009 09:28:05  _____________________________________________________________________  External Attachment:    Type:   Image     Comment:   External Document

## 2011-02-07 ENCOUNTER — Encounter: Payer: Self-pay | Admitting: *Deleted

## 2011-03-09 LAB — GC/CHLAMYDIA PROBE AMP, GENITAL
Chlamydia, DNA Probe: NEGATIVE
GC Probe Amp, Genital: NEGATIVE

## 2011-03-09 LAB — URINALYSIS, ROUTINE W REFLEX MICROSCOPIC
Nitrite: NEGATIVE
Specific Gravity, Urine: 1.022 (ref 1.005–1.030)
Urobilinogen, UA: 1 mg/dL (ref 0.0–1.0)

## 2011-03-09 LAB — URINE MICROSCOPIC-ADD ON

## 2011-03-09 LAB — WET PREP, GENITAL: Yeast Wet Prep HPF POC: NONE SEEN

## 2011-03-09 LAB — POCT I-STAT, CHEM 8
BUN: 11 mg/dL (ref 6–23)
Calcium, Ion: 1.08 mmol/L — ABNORMAL LOW (ref 1.12–1.32)
Chloride: 106 mEq/L (ref 96–112)
Creatinine, Ser: 0.7 mg/dL (ref 0.4–1.2)
Glucose, Bld: 105 mg/dL — ABNORMAL HIGH (ref 70–99)

## 2011-03-10 ENCOUNTER — Ambulatory Visit (INDEPENDENT_AMBULATORY_CARE_PROVIDER_SITE_OTHER): Payer: Self-pay | Admitting: Family Medicine

## 2011-03-10 ENCOUNTER — Other Ambulatory Visit: Payer: Self-pay | Admitting: Family Medicine

## 2011-03-10 ENCOUNTER — Encounter: Payer: Self-pay | Admitting: Family Medicine

## 2011-03-10 VITALS — BP 149/91 | HR 93 | Temp 98.1°F | Wt 221.8 lb

## 2011-03-10 DIAGNOSIS — Z331 Pregnant state, incidental: Secondary | ICD-10-CM

## 2011-03-10 DIAGNOSIS — N911 Secondary amenorrhea: Secondary | ICD-10-CM | POA: Insufficient documentation

## 2011-03-10 DIAGNOSIS — N912 Amenorrhea, unspecified: Secondary | ICD-10-CM

## 2011-03-10 DIAGNOSIS — IMO0001 Reserved for inherently not codable concepts without codable children: Secondary | ICD-10-CM

## 2011-03-10 MED ORDER — PRENATE ELITE 90-600-400 MG-MCG-MCG PO TABS: 1.0000 | ORAL_TABLET | Freq: Every day | ORAL | Status: DC

## 2011-03-10 NOTE — Patient Instructions (Signed)
It was nice to meet you today Please look over the information on the GIFT class Make your new OB appt Go for your ultrasound

## 2011-03-10 NOTE — Assessment & Plan Note (Signed)
Due to pregnancy.  Will set up for New OB and dating u/s

## 2011-03-10 NOTE — Progress Notes (Signed)
Pt has been on OCP since IUD taken out one year ago (pt reports taht it was falling out)  Missed several pills in Dec.  Had 2 days of spotting in January and 1 day of spotting in Feb.  Took home preg test and now wants to confirm.  PE Vital signs reviewed General appearance - alert, well appearing, and in no distress and oriented to person, place, and time Chest - clear to auscultation, no wheezes, rales or rhonchi, symmetric air entry, no tachypnea, retractions or cyanosis Heart - normal rate, regular rhythm, normal S1, S2, no murmurs, rubs, clicks or gallops

## 2011-03-10 NOTE — Assessment & Plan Note (Signed)
Pregnant, wanted pregnancy.  G3P2, last baby is 25 y/o.  Both full term.  Has been on pill so dates unsure.  Sending for dating u/s.nonsmoker, no drug use.  Will need new OB appt.  Gave info for GIFT class.

## 2011-03-13 ENCOUNTER — Telehealth: Payer: Self-pay | Admitting: Family Medicine

## 2011-03-14 ENCOUNTER — Ambulatory Visit (HOSPITAL_COMMUNITY)
Admission: RE | Admit: 2011-03-14 | Discharge: 2011-03-14 | Disposition: A | Discharge status: Home / Self Care | Payer: Self-pay | Source: Ambulatory Visit | Attending: Family Medicine | Admitting: Family Medicine

## 2011-03-14 ENCOUNTER — Other Ambulatory Visit: Payer: Self-pay | Admitting: Family Medicine

## 2011-03-14 DIAGNOSIS — Z331 Pregnant state, incidental: Secondary | ICD-10-CM

## 2011-03-14 DIAGNOSIS — Z3689 Encounter for other specified antenatal screening: Secondary | ICD-10-CM | POA: Insufficient documentation

## 2011-03-14 DIAGNOSIS — IMO0001 Reserved for inherently not codable concepts without codable children: Secondary | ICD-10-CM

## 2011-03-17 NOTE — Telephone Encounter (Signed)
Error

## 2011-03-18 ENCOUNTER — Other Ambulatory Visit: Payer: Self-pay

## 2011-03-18 DIAGNOSIS — Z3480 Encounter for supervision of other normal pregnancy, unspecified trimester: Secondary | ICD-10-CM

## 2011-03-18 NOTE — Progress Notes (Signed)
Kenard Gower pt for [employer] Prenatal Panel, HIV, Sickle Cell, and OB urine culture. 1Red 1Pink 1sst 1lavender and 1 urine. AC

## 2011-03-19 ENCOUNTER — Telehealth: Payer: Self-pay | Admitting: Family Medicine

## 2011-03-19 ENCOUNTER — Encounter: Payer: Self-pay | Admitting: Family Medicine

## 2011-03-19 LAB — OBSTETRIC PANEL
Antibody Screen: NEGATIVE
Basophils Absolute: 0 10*3/uL (ref 0.0–0.1)
Basophils Relative: 0 % (ref 0–1)
Eosinophils Relative: 1 % (ref 0–5)
HCT: 36.4 % (ref 36.0–46.0)
Hemoglobin: 12.5 g/dL (ref 12.0–15.0)
Lymphocytes Relative: 25 % (ref 12–46)
MCHC: 34.3 g/dL (ref 30.0–36.0)
MCV: 81.6 fL (ref 78.0–100.0)
Monocytes Absolute: 0.3 10*3/uL (ref 0.1–1.0)
Monocytes Relative: 5 % (ref 3–12)
RDW: 14.2 % (ref 11.5–15.5)
Rh Type: NEGATIVE
Rubella: 37.5 IU/mL — ABNORMAL HIGH

## 2011-03-19 LAB — HIV ANTIBODY (ROUTINE TESTING W REFLEX): HIV: NONREACTIVE

## 2011-03-19 NOTE — Telephone Encounter (Signed)
Letter up front. Pt informed Erica [name], Erica [name]

## 2011-03-19 NOTE — Telephone Encounter (Signed)
Please call pt back on cell phone @ [phone]

## 2011-03-19 NOTE — Telephone Encounter (Signed)
Need copy of EDD for medicaid.  Will pick up today when ready.

## 2011-03-20 ENCOUNTER — Telehealth: Payer: Self-pay | Admitting: Family Medicine

## 2011-03-20 ENCOUNTER — Other Ambulatory Visit: Payer: Self-pay | Admitting: Family Medicine

## 2011-03-20 LAB — CULTURE, OB URINE: Colony Count: 80000

## 2011-03-20 MED ORDER — NITROFURANTOIN MONOHYD MACRO 100 MG PO CAPS: 100.0000 mg | ORAL_CAPSULE | Freq: Two times a day (BID) | ORAL | Status: DC

## 2011-03-20 MED ORDER — NITROFURANTOIN MONOHYD MACRO 100 MG PO CAPS: 100.0000 mg | ORAL_CAPSULE | Freq: Two times a day (BID) | ORAL | Status: AC

## 2011-03-20 NOTE — Progress Notes (Signed)
Addended by: Ellery Plunk on: 03/20/2011 02:24 PM   Modules accepted: Orders

## 2011-03-20 NOTE — Telephone Encounter (Signed)
Called pt to discuss UTI.  Pt wants to go to [facility] pharmacy on [address].

## 2011-03-20 NOTE — Telephone Encounter (Signed)
Done

## 2011-03-20 NOTE — Telephone Encounter (Signed)
Erica [name] wanted to have the rx called to [facility] to be re-issued to [facility] on [address].  Can bet it cheaper with her drug discount card.

## 2011-03-26 ENCOUNTER — Encounter: Payer: Self-pay | Admitting: Family Medicine

## 2011-04-02 LAB — CBC
HCT: 28 % — ABNORMAL LOW (ref 36.0–46.0)
Hemoglobin: 10.9 g/dL — ABNORMAL LOW (ref 12.0–15.0)
MCHC: 33 g/dL (ref 30.0–36.0)
MCHC: 33 g/dL (ref 30.0–36.0)
MCV: 78.3 fL (ref 78.0–100.0)
MCV: 79.1 fL (ref 78.0–100.0)
Platelets: 146 10*3/uL — ABNORMAL LOW (ref 150–400)
RBC: 4.24 MIL/uL (ref 3.87–5.11)
RDW: 15 % (ref 11.5–15.5)
RDW: 15.3 % (ref 11.5–15.5)
WBC: 8.4 10*3/uL (ref 4.0–10.5)

## 2011-04-02 LAB — RH IMMUNE GLOB WKUP(>/=20WKS)(NOT WOMEN'S HOSP)

## 2011-04-07 ENCOUNTER — Ambulatory Visit (INDEPENDENT_AMBULATORY_CARE_PROVIDER_SITE_OTHER): Payer: Self-pay | Admitting: Family Medicine

## 2011-04-07 ENCOUNTER — Encounter: Payer: Self-pay | Admitting: Family Medicine

## 2011-04-07 VITALS — BP 111/69 | Wt 220.9 lb

## 2011-04-07 DIAGNOSIS — M7989 Other specified soft tissue disorders: Secondary | ICD-10-CM | POA: Insufficient documentation

## 2011-04-07 DIAGNOSIS — M79609 Pain in unspecified limb: Secondary | ICD-10-CM

## 2011-04-07 DIAGNOSIS — Z331 Pregnant state, incidental: Secondary | ICD-10-CM

## 2011-04-07 DIAGNOSIS — M79671 Pain in right foot: Secondary | ICD-10-CM | POA: Insufficient documentation

## 2011-04-07 LAB — URINE MICROSCOPIC-ADD ON

## 2011-04-07 LAB — URINALYSIS, ROUTINE W REFLEX MICROSCOPIC
Glucose, UA: NEGATIVE mg/dL
Ketones, ur: NEGATIVE mg/dL
Nitrite: NEGATIVE
Specific Gravity, Urine: 1.025 (ref 1.005–1.030)
pH: 7 (ref 5.0–8.0)

## 2011-04-07 LAB — GLUCOSE, CAPILLARY: Glucose-Capillary: 107 mg/dL — ABNORMAL HIGH (ref 70–99)

## 2011-04-07 LAB — URINE CULTURE

## 2011-04-07 MED ORDER — POST-OP SHOE/SOFT TOP WOMEN MISC: 1.0000 | Freq: Every day | Status: DC

## 2011-04-07 NOTE — Assessment & Plan Note (Signed)
Pretty sure is stress fracture vs stress reaction of 2nd metatarsal. Gradual onset of pain following increasing activity. No distinctive trauma.  Plan: Post op shoe for 4 weeks. No x-ray at this time as it would not change treatment.  Will follow.

## 2011-04-07 NOTE — Assessment & Plan Note (Signed)
Doubtful for DVT however she is higher risk as she is pregnant.  Will rule out with Doppler ultrasound of the right LE venus system. Will follow.

## 2011-04-07 NOTE — Progress Notes (Signed)
S: Doing well at 18 weeks. See flowsheet. Foot pain in right foot. Started 1.5 week ago gradually following a longer walk. Not hurts during and after walking. No trauma. Swelling is worsening in foot. No numbness or tingling.. Pregnancy is well. No issues. Like prior pregnancies O: VS noted. See sheet.  Gen: Well NAD Lungs: CTABL Heart RRR no MRG ABD: NABS, NT, ND fundus = dates Pelvic: Normal external and internal. No discharge.  Exts: Non tender non edemetus: Right LE is 2cm larger than left at 10cm distal from tibial tuberosity. Not red no cords. MSK: Right foot mildly swollen. TTP over 2nd metatarsal proximally. - squeeze test.    A/P 25 yo G3P2 at 19 weeks.  1) Pregnancy: Dating confirmed with both firm LMP and early ultrasound.  Doing well. Will need anatomy scan ASAP and early 1 hr Glucola for obesity.  Will f/u with Dr. Althia Forts for continuity care. Will check GC/CHL today as pap is up to date. No group prenatal visits.  2) Foot pain: Think stress fracture see below. 3) Leg swelling. Doubt DVT but will check LE doppler study. 4) F/u in 1 month.

## 2011-04-07 NOTE — Patient Instructions (Addendum)
Thank you for coming in today. Come back in 1 month. Please schedule with Dr. Hulen Luster or one of the "interns" Get the ultrasound of your belly and your leg. Use the shoe for 1 month.  Let me know how your foot feels.  Take your prenatal vitamins. Come back this or next week for your glucose test (just schedule a lab visit).

## 2011-04-08 LAB — GC/CHLAMYDIA PROBE AMP, GENITAL
Chlamydia, DNA Probe: NEGATIVE
GC Probe Amp, Genital: NEGATIVE

## 2011-04-10 ENCOUNTER — Other Ambulatory Visit: Payer: Self-pay | Admitting: Family Medicine

## 2011-04-10 ENCOUNTER — Other Ambulatory Visit: Payer: Self-pay

## 2011-04-11 ENCOUNTER — Telehealth: Payer: Self-pay | Admitting: Family Medicine

## 2011-04-11 NOTE — Telephone Encounter (Signed)
Pt checking status of ultrasound appt

## 2011-04-11 NOTE — Telephone Encounter (Signed)
To red team as Dr. Denyse Amass is seeing pt for her prgnancy

## 2011-04-11 NOTE — Telephone Encounter (Signed)
Pt called back. Informed her of when the [place] was. She will be there

## 2011-04-11 NOTE — Telephone Encounter (Signed)
Called patient left message to return call. [facility] [place] [name], Erica Medin

## 2011-04-14 ENCOUNTER — Encounter: Payer: Self-pay | Admitting: Family Medicine

## 2011-04-16 ENCOUNTER — Ambulatory Visit (HOSPITAL_COMMUNITY)
Admission: RE | Admit: 2011-04-16 | Discharge: 2011-04-16 | Disposition: A | Discharge status: Home / Self Care | Payer: Medicaid Other | Source: Ambulatory Visit | Attending: Family Medicine | Admitting: Family Medicine

## 2011-04-16 DIAGNOSIS — Z363 Encounter for antenatal screening for malformations: Secondary | ICD-10-CM | POA: Insufficient documentation

## 2011-04-16 DIAGNOSIS — Z331 Pregnant state, incidental: Secondary | ICD-10-CM

## 2011-04-16 DIAGNOSIS — Z1389 Encounter for screening for other disorder: Secondary | ICD-10-CM | POA: Insufficient documentation

## 2011-04-16 DIAGNOSIS — O358XX0 Maternal care for other (suspected) fetal abnormality and damage, not applicable or unspecified: Secondary | ICD-10-CM | POA: Insufficient documentation

## 2011-04-28 ENCOUNTER — Ambulatory Visit (INDEPENDENT_AMBULATORY_CARE_PROVIDER_SITE_OTHER): Payer: Medicaid Other | Admitting: Sports Medicine

## 2011-04-28 ENCOUNTER — Encounter: Payer: Self-pay | Admitting: Sports Medicine

## 2011-04-28 VITALS — BP 129/89 | HR 108 | Temp 97.7°F | Wt 219.0 lb

## 2011-04-28 DIAGNOSIS — J45909 Unspecified asthma, uncomplicated: Secondary | ICD-10-CM

## 2011-04-28 DIAGNOSIS — Z331 Pregnant state, incidental: Secondary | ICD-10-CM

## 2011-04-28 LAB — POCT WET PREP (WET MOUNT): Yeast Wet Prep HPF POC: NEGATIVE

## 2011-04-28 MED ORDER — LORATADINE 10 MG PO TABS: 10.0000 mg | ORAL_TABLET | Freq: Every day | ORAL | Status: DC | PRN

## 2011-04-28 MED ORDER — ALBUTEROL SULFATE HFA 108 (90 BASE) MCG/ACT IN AERS: 1.0000 | INHALATION_SPRAY | RESPIRATORY_TRACT | Status: DC | PRN

## 2011-04-28 NOTE — Assessment & Plan Note (Signed)
With some resolved spotting and no warning signs. Checking WP/Gc/Chlam. RTC if spotting recurs, leak, decr FM, regular CTX.

## 2011-04-28 NOTE — Assessment & Plan Note (Signed)
Pt needs refill on albuterol. Will also rx loratadine for allergic symptoms.

## 2011-04-28 NOTE — Progress Notes (Signed)
  Subjective:    Patient ID: Erica [name], female    DOB: Aug 03, 1986, 25 y.o.   MRN: [id number]  HPI Spotting since this AM, few drops.  No cramps, good FM, no leak, no CTX, no fevers/chills/no dysuria.   Usually happens after intercourse, last intercourse 2-3d ago so that was a change for her.    Some itchy eyes, runny nose, no ST/fevers/chills/sick contacts/body aches.  Sx better with tylenol.   Review of Systems    See HPI Objective:   Physical Exam  Constitutional: She appears well-developed and well-nourished. No distress.  Abdominal: Soft.       Gravid, appropriate for dates.  NT/ND, no guarding.  Genitourinary:       No external lesions, vault without bleeding, whitish discharge from cervix.  No other lesions seen.  No CMT, no adnexal tenderness.  FHR 150.          Assessment & Plan:

## 2011-04-28 NOTE — Patient Instructions (Signed)
Great to see you, I will call you with the results if there is anything that needs to be treated. Use the loratadine as prescribed. Come back to see [place] for your next scheduled prenatal visit.  Erica [name]. Benjamin Stain, M.D.

## 2011-04-29 ENCOUNTER — Telehealth: Payer: Self-pay | Admitting: *Deleted

## 2011-04-29 LAB — GC/CHLAMYDIA PROBE AMP, GENITAL: GC Probe Amp, Genital: NEGATIVE

## 2011-04-29 NOTE — Telephone Encounter (Signed)
Message copied by Jimmy Footman on Tue Apr 29, 2011 12:24 PM ------      Message from: Rodney Langton      Created: Tue Apr 29, 2011 10:14 AM       Pls let pt know all tests were negative.

## 2011-04-29 NOTE — Telephone Encounter (Signed)
Spoke with patient and informed her that her tests were negative

## 2011-05-07 NOTE — Progress Notes (Signed)
Chart reviewed, faxed to [facility].  Agree with Dr. Zollie Pee assessment and plan, due for RhoGam at 28 weeks (is Rh-negative, and Antibody negative). Erica [name]

## 2011-05-09 ENCOUNTER — Ambulatory Visit (INDEPENDENT_AMBULATORY_CARE_PROVIDER_SITE_OTHER): Payer: Medicaid Other | Admitting: Family Medicine

## 2011-05-09 ENCOUNTER — Encounter: Payer: Self-pay | Admitting: Family Medicine

## 2011-05-09 VITALS — BP 130/84 | Wt 226.0 lb

## 2011-05-09 DIAGNOSIS — Z331 Pregnant state, incidental: Secondary | ICD-10-CM

## 2011-05-09 NOTE — Discharge Summary (Signed)
NAMEJILLIAM, Erica [name]              ACCOUNT NO.:  [id number]   MEDICAL RECORD NO.:  [id number]          PATIENT TYPE:  WOC   LOCATION:  WOC                          FACILITY:  [facility]   PHYSICIAN:  Lesly Dukes, M.D. DATE OF BIRTH:  Aug 10, 1986   DATE OF ADMISSION:  08/31/2006  DATE OF DISCHARGE:  09/04/2006                                 DISCHARGE SUMMARY   REASON FOR ADMISSION:  This is a 25 year old G1, P0, who presented at 92 5/[redacted]  weeks gestational age with pregnancy induced hypertension for induction of  labor.  The patient presented with initial blood pressure of 159/114.  The  patient's initial pregnancy induced hypertension labs were negative and the  patient had no symptoms of pre-eclampsia.  The patient was initially induced  with Cytotec given her unfavorable cervix.  She was continued to be ripened  with Cytotec until her cervix was favorable at which point she was  transitioned to Pitocin for induction of labor.  The patient eventually  delivered on September 02, 2006, via normal spontaneous vaginal delivery.  She delivered a female infant and did have a posterior shoulder delivered  first secondary to mild dystocia.  There were no nuchal cords.  The cord was  clamped and cut and the baby was carried to the warmer.  Cord blood was  collected and the three vessel cord.  Placenta was delivered spontaneously.  The fundus was firm and there were no perineal lacerations, however, a  superficial vaginal laceration was repaired with 3-0 Vicryl.  The patient  and baby were both stable following delivery and estimated blood loss was  minimal.  The patient was Rh negative and did receive RhoGAM postpartum.   TREATMENT:  Given the patient's elevated blood pressures and pregnancy  induced hypertension, the patient was begun on magnesium at the time of  admission.  She did continue to have mildly elevated blood pressures, but  remained asymptomatic without any signs or symptoms of  pre-eclampsia  throughout her admission.  She was initially begun with a 4 gram mag load  and then continued on 2 grams mag per hour.  The magnesium was continued for  24 hours postpartum at which point the patient's inputs and outputs were  stable and the mag was discontinued.  24 hours prior to discharge, the  patient's blood pressure ranged from 136 to 151 systolics over 93 to 97  diastolics.  Her vital signs were, otherwise, stable.  The patient did have  a hemoglobin of 7.6 at the time of discharge and is, therefore, discharged  on iron supplementation with Colace to prevent constipation.   DISCHARGE MEDICATIONS:  1. Ibuprofen 600 mg every 6 hours as needed for pain.  2. Prenatal vitamin daily for six weeks postpartum and while breast      feeding.  3. Colace 100 mg b.i.d. for postpartum constipation.  4. Iron sulfate 325 mg b.i.d. for postpartum anemia.  5. The patient will receive IUD at six weeks postpartum and was given Depo      injection IM x1 prior to discharge.   DISCHARGE INSTRUCTIONS:  The patient should undergo pelvic rest for six  weeks postpartum which includes no intercourse for six weeks.  The patient  may have regular postpartum diet and should follow her activity instruction  booklet at discharge.   FOLLOW UP:  The patient is to return to [facility] in  six weeks for postpartum check.   DISPOSITION:  The patient is discharged to home in stable condition.   DISCHARGE DIAGNOSIS:  1. Pregnancy induced hypertension treated with magnesium.  2. Normal spontaneous vaginal delivery.     ______________________________  Drue Dun, M.D.    ______________________________  Lesly Dukes, M.D.    EE/MEDQ  D:  09/04/2006  T:  09/05/2006  Job:  [id number]

## 2011-05-09 NOTE — Patient Instructions (Addendum)
It was good to meet you.  At your next visit we will give you your Rho Gam, it should be in about four weeks.  Please make an appointment to see me again in four weeks.  We will also plan to repeat the blood sugar test that visit.  Keep taking your prenatal vitamins.

## 2011-05-12 NOTE — Progress Notes (Signed)
Subjective:    Erica [name] is a 25 y.o. female being seen today for her obstetrical visit. She is at [redacted]w[redacted]d gestation. Patient reports no complaints. Fetal movement: normal.  Menstrual History: OB History    Grav Para Term Preterm Abortions TAB SAB Ect Mult Living   3 2 2  0 0 0 0 0 0 2      Patient's last menstrual period was 11/23/2010.   Review of Systems No back pain, no LE edema, no vaginal discharge or bleeding.    Objective:    BP 130/84  Wt 226 lb (102.513 kg)  LMP 11/23/2010 FHT: 135 BPM  Uterine Size: size equals dates     Assessment:    Pregnancy 23 and 6/7 weeks   Plan:   Plan to give Rhogam next visit Also, plan to repeat a glucola next visit.  Follow up in 4 weeks.

## 2011-05-21 NOTE — Progress Notes (Signed)
Chart reviewed and I agree with Dr. Melina Modena assessment and plan to administer RhoGam and recheck GTT at 28 week visit.  Erica [name] O

## 2011-06-05 ENCOUNTER — Ambulatory Visit (INDEPENDENT_AMBULATORY_CARE_PROVIDER_SITE_OTHER): Payer: Medicaid Other | Admitting: Family Medicine

## 2011-06-05 DIAGNOSIS — Z331 Pregnant state, incidental: Secondary | ICD-10-CM

## 2011-06-05 DIAGNOSIS — Z3480 Encounter for supervision of other normal pregnancy, unspecified trimester: Secondary | ICD-10-CM

## 2011-06-05 DIAGNOSIS — E669 Obesity, unspecified: Secondary | ICD-10-CM

## 2011-06-05 LAB — CBC
HCT: 35.4 % — ABNORMAL LOW (ref 36.0–46.0)
Hemoglobin: 12.1 g/dL (ref 12.0–15.0)
MCH: 29.4 pg (ref 26.0–34.0)
MCHC: 34.2 g/dL (ref 30.0–36.0)
MCV: 86.1 fL (ref 78.0–100.0)
RBC: 4.11 MIL/uL (ref 3.87–5.11)

## 2011-06-05 MED ORDER — RHO D IMMUNE GLOBULIN 300 MCG IM INJ
300.0000 ug | INJECTION | Freq: Once | INTRAMUSCULAR | Status: AC
  Administered 2011-06-05: 300 ug via INTRAMUSCULAR

## 2011-06-05 NOTE — Patient Instructions (Addendum)
It was good to see you.  I want you to make your next appointment to be in [facility] (here in our office) in two weeks.  I will see you at the next appointment (two weeks after that).   Keep taking your prenatatal vitamin.  Call the office with any questions or concerns.

## 2011-06-05 NOTE — Progress Notes (Signed)
Erica [name] returns for OB Follow up.  She is 27 weeks and 5 days.  She complains only of some right hip pain when she sleeps on that side.   RhoGam given today, 1 hour Glucola done today.  CBC, HIV, & RPR repeated today.   Will have pt follow up in OB clinic in two weeks, then again with me in 4 weeks.

## 2011-06-06 LAB — ANTIBODY SCREEN: Antibody Screen: NEGATIVE

## 2011-06-09 LAB — GLUCOSE, CAPILLARY: Glucose-Capillary: 86 mg/dL (ref 70–99)

## 2011-06-19 ENCOUNTER — Encounter: Payer: Self-pay | Admitting: Family Medicine

## 2011-06-19 ENCOUNTER — Ambulatory Visit (INDEPENDENT_AMBULATORY_CARE_PROVIDER_SITE_OTHER): Payer: Medicaid Other | Admitting: Family Medicine

## 2011-06-19 DIAGNOSIS — Z331 Pregnant state, incidental: Secondary | ICD-10-CM

## 2011-06-19 NOTE — Patient Instructions (Signed)
It was a pleasure to see you today in [facility]. Today you are at 28 weeks and 4 days of pregnancy based on your earliest ultrasound.   You received your RhoGam shot at 26weeks 4 days; we should consider repeating RhoGam after 38 weeks if you haven't delivered by 12 weeks after the first RhoGam.   Please make appointment with Dr Lula Olszewski in 2 more weeks (you have an appointment already made for July 13th).  Preterm Labor, Home Care  Preterm labor is defined as having uterine contractions that cause the cervix to open (dilate), shorten and thin (effacement) before completing 37 weeks of pregnancy. Preterm labor accounts for most hospital admissions in pregnant women.  CAUSES  Most cases of preterm labor are unknown.   Small areas of separation of the placenta (abruption).   Excess fluid in the amniotic sac (poly hydramnios).   Twins or more.   The cervix cannot hold the baby because the tissue in the cervix is too weak (incompetent cervix).   Hormone changes.   Vaginal bleeding in more than one of the trimesters.   Infection of the cervix, vagina or bladder.   Smoking.   Antiphosolipid Syndrome. This happens when antibodies affect the protein in the body.  DIAGNOSIS Factors that help predict preterm labor:  History of preterm labor with a past pregnancy.   Bacterial vaginosis in women who previously had preterm labor.   Home uterine activity monitoring that show uterine contractions.   Fetal fibronectin protein that is elevated in women with previous history of preterm labor.   Ultrasound to measure the length of the cervix, if it shows signs of shortening before the due date, it may be a sign of preterm labor.   Using the fibronectin and cervical ultrasound evaluation together is more predictive of impending preterm labor.   Other risk factors include:   Nonwhite race.   Pregnancy in a 33 year old or younger.   Pregnancy in a 8 year old or older.   Low  socioeconomic factors.   Low weight gain during the pregnancy.  PREVENTION Not all preterm labor can be prevented. Some early contractions can be prevented with simple measures.  Drink fluids. Drink eight, 8 ounce glasses of fluids per day. Preterm labor rates go up in the summer months. Dehydration makes the blood volume decrease. This increases the concentration of oxytocin (hormone that causes uterine contractions) in the blood. Hydrating yourself helps prevent this build up.   Watch for signs of infection. Signs include burning during urination, increased need to urinate, abnormal vaginal discharge or unexplained fevers.   Keep your appointments with your caregiver. Call your caregiver right away if you think you are having uterine contractions.   Seek medical advice with questions or problems. It is much better to ask questions of your caregiver than to be in untreated preterm labor unknowingly.  MANAGEMENT OF PRETERM LABOR, IN & OUT OF THE HOSPITAL There are a lot of things to manage in preterm labor. These things include both medical measures and personal care measures for you and/or your baby. Most preterm labor will be handled in the hospital. Things that may be helpful in preterm labor include:  Hydration (oral or IV). Take in eight, 8 ounce glasses of water per day.   Bed rest (home or hospital). Lying on your left side may help.   Avoid intercourse and orgasms.   Medication (antibiotics) to help prevent infection. This is more likely if your membranes have ruptured  or if the contractions are caused by infection. Take medications as directed.   Evaluation of your baby. These tests or procedures help the caregiver know how the baby is doing and may do in the case of an early birth. Including:   Biophysical profile.   Non-stress or stress tests.   Amniocentesis to evaluate the baby for fetal lung maturity.   Amniotic fluid volume index (AFI).   An ultrasound.    Medications (steroids) to help your baby's lungs mature more quickly may be used. This may happen if preterm birth cannot be stopped.   Tocolytic medications (medications that help stop uterine contractions) may help prolong the pregnancy up to 7 days. This is helpful if steroids medication is needed to help the baby's lungs mature.   Your caregiver may give other advice on preparation for preterm birth.   Progesterone may be beneficial in some cases of preterm labor.  TREATMENT The best treatment is prevention, being aware of risk factors and early detection. Make sure to ask your caregiver to discuss with you the signs and symptoms of preterm labor, especially if you had preterm labor with a previous pregnancy. HOME CARE INSTRUCTIONS  Eat a balanced and nourished diet.   Take your vitamin supplements as directed.   Drink 6 to 8 glasses of liquids a day.   Get plenty of rest and sleep.   Do not have sexual relations if you have preterm labor or are at high risk of having preterm labor.   Follow your care giver's recommendation regarding activities, medications, blood and other tests (ultrasound, amniocentesis, etc.).   Avoid stress.   Avoid hard labor or prolonged exercise if you are at high risk for preterm labor.   Do not smoke.  SEEK IMMEDIATE MEDICAL CARE IF:  You are having contractions.   You have abdominal pain.   You have vaginal bleeding.   You have painful urination.   You have abnormal discharge.   You develop a temperature 102 F (38.9 C) or higher.  Document Released: 12/08/2005 Document Re-Released: 03/04/2010 [facility] Patient Information 2011 [place], [place].

## 2011-06-19 NOTE — Progress Notes (Signed)
Patient seen today in OB clinic.  She had early [place] at [redacted]w[redacted]d that was discrepant from LMP by 8 days; therefore, the working EDD is changed to 09/07/2011. Patient is Rh-negative, received RhoGam at 26.4 weeks.  Would recommend repeat RhoGam dosing if still pregnant at [redacted] weeks past initial RhoGam dosing (ie, at 38.4weeks).  She is antibody-negative.   Reports watery fluid running down leg after intercourse, occurs at no other time. No dysuria.  No bleeding.  Does not last long.  Doesn't use condoms, is thought to possibly be semen. PHQ9 and Pregnancy Medical Home Risk Screening Form completed: PHQ9 score zero; no risk factors identified on PMH screen. Is interesting in breastfeeding.  Had attempted with her first child, but had mastitis and did not continue.  Also interested in Mirena, had 2 placed before and both partially fell out.  Is considering Implanon.  Not decided yet.  To continue this discussion with PCP. She has follow up visit with Dr Lula Olszewski scheduled, for q2week visits until 36 weeks, then weekly.

## 2011-06-26 ENCOUNTER — Telehealth: Payer: Self-pay | Admitting: Family Medicine

## 2011-06-26 NOTE — Telephone Encounter (Signed)
Returned call.  No answer and no VM to leave a message.  If she calls back please tell her that hot tub use duirng pregnancy is highly discouraged - may cause damage to the fetus.  She can call back if she has more specific questions about this.

## 2011-06-26 NOTE — Telephone Encounter (Signed)
Is [redacted] weeks pregnant and is getting ready to go on vacation and is wondering if it is ok to get in a hot tub.

## 2011-07-02 ENCOUNTER — Encounter: Payer: Self-pay | Admitting: Family Medicine

## 2011-07-04 ENCOUNTER — Ambulatory Visit (INDEPENDENT_AMBULATORY_CARE_PROVIDER_SITE_OTHER): Payer: Medicaid Other | Admitting: Family Medicine

## 2011-07-04 DIAGNOSIS — Z331 Pregnant state, incidental: Secondary | ICD-10-CM

## 2011-07-04 DIAGNOSIS — Z6791 Unspecified blood type, Rh negative: Secondary | ICD-10-CM

## 2011-07-04 DIAGNOSIS — O360990 Maternal care for other rhesus isoimmunization, unspecified trimester, not applicable or unspecified: Secondary | ICD-10-CM

## 2011-07-04 DIAGNOSIS — O26899 Other specified pregnancy related conditions, unspecified trimester: Secondary | ICD-10-CM | POA: Insufficient documentation

## 2011-07-04 DIAGNOSIS — Z3480 Encounter for supervision of other normal pregnancy, unspecified trimester: Secondary | ICD-10-CM

## 2011-07-04 NOTE — Patient Instructions (Signed)
It was good to se you, you are 30 weeks and 5 days today.  Please make an appointment to see me again in 2 weeks.  Remember reasons to go to [facility]: Vaginal bleeding, your water breaks, vaginal bleeding, or frequent contractions.

## 2011-07-04 NOTE — Progress Notes (Signed)
Erica [name] returns for [facility] follow up.  She is [redacted]w[redacted]d by 14 week [place].  She complains of a little back pain, but otherwise has no complaints.  She is still deciding about contraception, but is planning on breast feeding.  She is going to have a boy, and wants to have him circumcised.  She is planning on taking him to [facility] for the circumcision because of the cost at [facility].  She is planning on bringing the baby here to [facility] medicine for his regular care.   -Pt undecided on birthcontrol, possibly nextplanon Rh-Neg, received rhogam and 26.4 due to incorrect dates, will need repeat at 38.4. -planning to breast feed -follow up in two weeks] -preterm labor precautions reviewed today

## 2011-07-22 ENCOUNTER — Ambulatory Visit (INDEPENDENT_AMBULATORY_CARE_PROVIDER_SITE_OTHER): Payer: Medicaid Other | Admitting: Family Medicine

## 2011-07-22 DIAGNOSIS — Z3480 Encounter for supervision of other normal pregnancy, unspecified trimester: Secondary | ICD-10-CM

## 2011-07-22 DIAGNOSIS — Z6791 Unspecified blood type, Rh negative: Secondary | ICD-10-CM

## 2011-07-22 DIAGNOSIS — O360990 Maternal care for other rhesus isoimmunization, unspecified trimester, not applicable or unspecified: Secondary | ICD-10-CM

## 2011-07-22 DIAGNOSIS — Z331 Pregnant state, incidental: Secondary | ICD-10-CM

## 2011-07-22 NOTE — Progress Notes (Signed)
Erica [name] returns for [name] follow up.  She is 104w2d by 14 week [place].  She complains of round ligament pain. She recently had a rash under her breasts.  She tried cornstarch which did not help, but then she used hydrocortisone cream and it got better.   On exam, no erythema, but hyperpigmentation under the breasts.  No active yeast.   - Advised OTC lamisil and hydrocortisone if rash returns.  -Pt undecided on birthcontrol, possibly nextplanon Rh-Neg, received rhogam and 26.4 due to incorrect dates, will need repeat at 38.4. -planning to breast feed -follow up in two weeks -fetal kick counts reviewed.  -preterm labor precautions reviewed today

## 2011-07-22 NOTE — Patient Instructions (Signed)
It was good to see you.  You are 33 weeks and 2 days along.  Please make an appointment to see me again in two weeks.  If the rash returns, it is OK to alternate an over the counter anti-yeast cream like Lamisil and hydrocortisone cream.

## 2011-08-01 ENCOUNTER — Inpatient Hospital Stay (HOSPITAL_COMMUNITY): Payer: Medicaid Other

## 2011-08-01 ENCOUNTER — Inpatient Hospital Stay (HOSPITAL_COMMUNITY)
Admission: AD | Admit: 2011-08-01 | Discharge: 2011-08-08 | DRG: 781 | Disposition: A | Discharge status: Home / Self Care | Payer: Medicaid Other | Source: Ambulatory Visit | Attending: Obstetrics & Gynecology | Admitting: Obstetrics & Gynecology

## 2011-08-01 ENCOUNTER — Encounter (HOSPITAL_COMMUNITY): Payer: Self-pay | Admitting: *Deleted

## 2011-08-01 DIAGNOSIS — O99891 Other specified diseases and conditions complicating pregnancy: Secondary | ICD-10-CM | POA: Diagnosis present

## 2011-08-01 DIAGNOSIS — O26879 Cervical shortening, unspecified trimester: Secondary | ICD-10-CM

## 2011-08-01 DIAGNOSIS — O4690 Antepartum hemorrhage, unspecified, unspecified trimester: Principal | ICD-10-CM | POA: Diagnosis present

## 2011-08-01 DIAGNOSIS — O26899 Other specified pregnancy related conditions, unspecified trimester: Secondary | ICD-10-CM

## 2011-08-01 DIAGNOSIS — O2390 Unspecified genitourinary tract infection in pregnancy, unspecified trimester: Secondary | ICD-10-CM | POA: Diagnosis present

## 2011-08-01 DIAGNOSIS — Z3480 Encounter for supervision of other normal pregnancy, unspecified trimester: Secondary | ICD-10-CM

## 2011-08-01 DIAGNOSIS — J45909 Unspecified asthma, uncomplicated: Secondary | ICD-10-CM | POA: Diagnosis present

## 2011-08-01 DIAGNOSIS — O4693 Antepartum hemorrhage, unspecified, third trimester: Secondary | ICD-10-CM | POA: Diagnosis present

## 2011-08-01 DIAGNOSIS — Z331 Pregnant state, incidental: Secondary | ICD-10-CM

## 2011-08-01 DIAGNOSIS — N760 Acute vaginitis: Secondary | ICD-10-CM | POA: Diagnosis present

## 2011-08-01 DIAGNOSIS — A499 Bacterial infection, unspecified: Secondary | ICD-10-CM | POA: Diagnosis present

## 2011-08-01 DIAGNOSIS — Z6791 Unspecified blood type, Rh negative: Secondary | ICD-10-CM

## 2011-08-01 DIAGNOSIS — B9689 Other specified bacterial agents as the cause of diseases classified elsewhere: Secondary | ICD-10-CM | POA: Diagnosis present

## 2011-08-01 LAB — TYPE AND SCREEN: Antibody Screen: NEGATIVE

## 2011-08-01 LAB — CBC
Hemoglobin: 11 g/dL — ABNORMAL LOW (ref 12.0–15.0)
MCHC: 33.8 g/dL (ref 30.0–36.0)
Platelets: 123 10*3/uL — ABNORMAL LOW (ref 150–400)
RBC: 3.83 MIL/uL — ABNORMAL LOW (ref 3.87–5.11)

## 2011-08-01 LAB — KLEIHAUER-BETKE STAIN
Fetal Cells %: 0 %
Quantitation Fetal Hemoglobin: 0 mL

## 2011-08-01 MED ORDER — DOCUSATE SODIUM 100 MG PO CAPS
100.0000 mg | ORAL_CAPSULE | Freq: Every day | ORAL | Status: DC
  Administered 2011-08-01 – 2011-08-07 (×7): 100 mg via ORAL
  Filled 2011-08-01 (×9): qty 1

## 2011-08-01 MED ORDER — ACETAMINOPHEN 325 MG PO TABS: 650.0000 mg | ORAL_TABLET | ORAL | Status: DC | PRN

## 2011-08-01 MED ORDER — ALBUTEROL SULFATE HFA 108 (90 BASE) MCG/ACT IN AERS
1.0000 | INHALATION_SPRAY | RESPIRATORY_TRACT | Status: DC | PRN
  Filled 2011-08-01: qty 6.7

## 2011-08-01 MED ORDER — ZOLPIDEM TARTRATE 10 MG PO TABS: 10.0000 mg | ORAL_TABLET | Freq: Every evening | ORAL | Status: DC | PRN

## 2011-08-01 MED ORDER — PRENATAL PLUS 27-1 MG PO TABS
1.0000 | ORAL_TABLET | Freq: Every day | ORAL | Status: DC
  Administered 2011-08-01 – 2011-08-08 (×8): 1 via ORAL
  Filled 2011-08-01 (×10): qty 1

## 2011-08-01 MED ORDER — CALCIUM CARBONATE ANTACID 500 MG PO CHEW
2.0000 | CHEWABLE_TABLET | ORAL | Status: DC | PRN
  Filled 2011-08-01: qty 2

## 2011-08-01 MED ORDER — RHO D IMMUNE GLOBULIN 1500 UNIT/2ML IJ SOLN: 300.0000 ug | Freq: Once | INTRAMUSCULAR | Status: DC

## 2011-08-01 MED ORDER — RHO D IMMUNE GLOBULIN 1500 UNIT/2ML IJ SOLN
300.0000 ug | Freq: Once | INTRAMUSCULAR | Status: AC
  Administered 2011-08-01: 300 ug via INTRAMUSCULAR

## 2011-08-01 MED ORDER — FLUTICASONE-SALMETEROL 250-50 MCG/DOSE IN AEPB
1.0000 | INHALATION_SPRAY | Freq: Every day | RESPIRATORY_TRACT | Status: DC
  Administered 2011-08-02 – 2011-08-08 (×7): 1 via RESPIRATORY_TRACT
  Filled 2011-08-01: qty 14

## 2011-08-01 NOTE — ED Provider Notes (Signed)
History     Chief Complaint  Patient presents with  . Vaginal Bleeding   HPI Presents with c/o seeing bright red blood in toilet this morning. Did have some cramping yesterday, but no strong contractions. No leaking of fluid. History of spotting early in pregnancy. Followed at [facility].   Past Medical History  Diagnosis Date  . Asthma   . Rh incompatibility   . Varicosities     No past surgical history on file.  Family History  Problem Relation Age of Onset  . Asthma Mother   . Hypertension Mother   . Hypertension Father   . Drug abuse Father   . Hepatitis Daughter     History  Substance Use Topics  . Smoking status: Never Smoker   . Smokeless tobacco: Never Used  . Alcohol Use: No    Allergies: No Known Allergies  Prescriptions prior to admission  Medication Sig Dispense Refill  . albuterol (PROVENTIL HFA;VENTOLIN HFA) 108 (90 BASE) MCG/ACT inhaler Inhale 1-2 puffs into the lungs as needed. As needed for shortness of breath       . Fluticasone-Salmeterol (ADVAIR DISKUS) 250-50 MCG/DOSE AEPB Inhale 1 puff into the lungs daily.       . prenatal vitamin w/FE, FA (PRENATAL 1 + 1) 27-1 MG TABS Take 1 tablet by mouth daily.        . Elastic Bandages & Supports (POST-OP SHOE/SOFT TOP WOMEN) MISC 1 Device by Does not apply route daily.  1 each  0  . Prenat w/o A-FE-DSS-Methfol-FA (PRENATAL MULTIVITAMIN) 90-600-400 MG-MCG-MCG tablet Take 1 tablet by mouth daily.  30 tablet  11    ROS Negative except vaginal bleeding.  Physical Exam   Blood pressure 125/75, pulse 90, temperature 97.6 F (36.4 C), temperature source Oral, resp. rate 20, height 5\' 3"  (1.6 m), weight 237 lb 9.6 oz (107.775 kg), last menstrual period 11/23/2010.  Physical Exam  Constitutional: She is oriented to person, place, and time. She appears well-developed and well-nourished.  HENT:  Head: Normocephalic.  Respiratory: Effort normal.  GI: Soft. There is no tenderness.  Genitourinary: Uterus normal.  Vaginal discharge (Moderate amount wine colored discharge.  Negative ferning) found.  Neurological: She is alert and oriented to person, place, and time.  Skin: Skin is warm and dry.  Psychiatric: She has a normal mood and affect.  FHR reassuring and reactive with occasional contraction.   MAU Course  Procedures   Assessment and Plan  IUP at 34.5 weeks. Third Trimester Bleeding, unknown cause  Will check OB U/S for r/o abruption   [facility] 08/01/2011, 7:56 AM

## 2011-08-01 NOTE — H&P (Signed)
Erica [name] is a 25 y.o. female presenting for vaginal bleeding. Maternal Medical History:  Reason for admission: Reason for admission: vaginal bleeding.    Patient is a 25yo [id number] @ 34+5wks dated by LMP and confirmed by u/s who presents with one day history of vaginal bleeding.  This morning, she noticed bright red blood in the toilet this morning.  She had some cramping yesterday but no contractions.  She denies any LOF.  Reports +FM.  She did have some spotting in early pregnancy that resolved.    She is followed by [facility].  Her pregnancy is complicated by Rh negative, asthma, obesity  OB History    Grav Para Term Preterm Abortions TAB SAB Ect Mult Living   3 2 2  0 0 0 0 0 0 2     Past Medical History  Diagnosis Date  . Asthma   . Rh incompatibility   . Varicosities    No past surgical history on file. Family History: family history includes Asthma in her mother; Drug abuse in her father; Hepatitis in her daughter; and Hypertension in her father and mother. Social History:  reports that she has never smoked. She has never used smokeless tobacco. She reports that she does not drink alcohol or use illicit drugs.  ROS Review of Systems - General ROS: negative for - fever Respiratory ROS: no cough, shortness of breath, or wheezing Cardiovascular ROS: no chest pain or dyspnea on exertion Gastrointestinal ROS: no abdominal pain, change in bowel habits, or black or bloody stools Genito-Urinary ROS: positive for - vaginal bleeding  Dilation: 1 Effacement (%): 50 Station: -3 Exam by:: Williams CNM Blood pressure 125/72, pulse 93, temperature 97.6 F (36.4 C), temperature source Oral, resp. rate 20, height 5\' 3"  (1.6 m), weight 237 lb 9.6 oz (107.775 kg), last menstrual period 11/23/2010. Exam Physical Exam  General: Well appearing, no acute distress Cardiac: RRR no murmurs, 2+ pulses Resp: clear to auscultation Abdomen: gravid, soft, nontender Extremities: no  edema  Prenatal labs: ABO, Rh:  Rh neg Antibody: NEG (06/14 1454) Rubella:  immune RPR: NON REAC (06/14 1448)  HBsAg: NEGATIVE (03/27 1026)  HIV: NON REACTIVE (06/14 1448)  GBS:   unknown  Assessment/Plan: 1. 3rd trimester vaginal bleeding - ultrasound performed showing no signs of previa or abruption.  Currently hemodynamically stable.   Will admit for monitoring on antepartum with continuous fetal monitoring.  Patient will receive a rhogam 2/2 Rh negative status 2. Asthma - continue on home medications   Lindaann Slough MD 08/01/2011, 9:08 AM  This case has been discussed with [name], PA and Dr Shawnie Pons who are in agreement with this plan.

## 2011-08-01 NOTE — ED Notes (Signed)
Returned from [place] waiting on official report.  PA received verbal.  No complaints from pt

## 2011-08-01 NOTE — ED Notes (Signed)
Pt denies any pain.  Attempted to contact Erica [name], CNM. Phone rang to L&D.  Asked Hilda Lias to see pt.  Pt off monitor up to BR.

## 2011-08-01 NOTE — H&P (Signed)
Chart reviewed and agree with management and plan.  

## 2011-08-01 NOTE — Progress Notes (Signed)
"  I started spotting on Thursday morning, but I didn't think anything of it because it went away after I took my shower.  When I woke up this morning I went to use the BR and there was a lot of bright red blood in the toilet.  I didn't put on a pad.  I just came straight here."  Pt reports (+) FM

## 2011-08-02 DIAGNOSIS — Z3480 Encounter for supervision of other normal pregnancy, unspecified trimester: Secondary | ICD-10-CM

## 2011-08-02 DIAGNOSIS — O4693 Antepartum hemorrhage, unspecified, third trimester: Secondary | ICD-10-CM | POA: Diagnosis present

## 2011-08-02 DIAGNOSIS — O360990 Maternal care for other rhesus isoimmunization, unspecified trimester, not applicable or unspecified: Secondary | ICD-10-CM

## 2011-08-02 NOTE — Progress Notes (Addendum)
FACULTY PRACTICE ANTEPARTUM(COMPREHENSIVE) NOTE  Erica [name] is a 25 y.o. G3P2002 at [redacted]w[redacted]d by 14 wk u/s who is admitted for vaginal bleeding.   Fetal presentation is cephalic. Length of Stay:  1  Days  Subjective: Continues spotting. Patient reports the fetal movement as active. Patient reports uterine contraction  activity as none. Patient reports  vaginal bleeding as scant staining. Patient describes fluid per vagina as None.  Vitals:  Blood pressure 145/83, pulse 88, temperature 97.9 F (36.6 C), temperature source Oral, resp. rate 20, height 5\' 3"  (1.6 m), weight 107.775 kg (237 lb 9.6 oz), last menstrual period 11/23/2010. Physical Examination:  General appearance - alert, well appearing, and in no distress Abdomen - soft, nontender, nondistended, no masses or organomegaly Gravid Fundal Height:  size equals dates Pelvic Exam:  examination not indicated Cervical Exam: Not evaluated.  Extremities: extremities normal, atraumatic, no cyanosis or edema  Membranes:intact  Fetal Monitoring:  Baseline: 150 bpm, Variability: Good {> 6 bpm) and Accelerations: Reactive Toco:  Irritabiltiy  Labs:  Recent Results (from the past 24 hour(s))  RH IG WORKUP, [facility]   Collection Time   08/01/11 11:15 AM      Component Value Range   Gestational Age(Wks) 34.5     ABO/RH(D) A NEG     Fetal Screen NEG     Unit Number [id number]     Blood Component Type RHIG     Unit division 00     Status of Unit ISSUED     Transfusion Status OK TO TRANSFUSE    CBC   Collection Time   08/01/11 11:15 AM      Component Value Range   WBC 5.4  4.0 - 10.5 (K/uL)   RBC 3.83 (*) 3.87 - 5.11 (MIL/uL)   Hemoglobin 11.0 (*) 12.0 - 15.0 (g/dL)   HCT 81.1 (*) 91.4 - 46.0 (%)   MCV 84.9  78.0 - 100.0 (fL)   MCH 28.7  26.0 - 34.0 (pg)   MCHC 33.8  30.0 - 36.0 (g/dL)   RDW 78.2  95.6 - 21.3 (%)   Platelets 123 (*) 150 - 400 (K/uL)  KLEIHAUER-BETKE STAIN   Collection Time   08/01/11 11:15  AM      Component Value Range   Fetal Cells % 0.0     Quantitation Fetal Hemoglobin 0    TYPE AND SCREEN   Collection Time   08/01/11 11:15 AM      Component Value Range   ABO/RH(D) A NEG     Antibody Screen NEG     Sample Expiration 08/04/2011    ABO/RH   Collection Time   08/01/11 11:15 AM      Component Value Range   ABO/RH(D) A NEG      Imaging Studies:    Reviewed-vtx, nml AFI, EFW @ 67%  Medications:  Scheduled    . docusate sodium  100 mg Oral Daily  . Fluticasone-Salmeterol  1 puff Inhalation Daily  . prenatal vitamin w/FE, FA  1 tablet Oral Daily  . rho(d) immune globulin  300 mcg Intramuscular Once  . DISCONTD: rho(d) immune globulin  300 mcg Intramuscular Once   I have reviewed the patient's current medications.  ASSESSMENT: Patient Active Problem List  Diagnoses  . OBESITY, UNSPECIFIED  . ASTHMA, PERSISTENT, MILD  . ACROCHORDON  . Supervision of other normal pregnancy  . Third trimester bleeding, antepartum    PLAN: Continue inpatient monitoring today and probably for 1 wk  Rachard Isidro S  08/02/2011,7:15 AM

## 2011-08-03 DIAGNOSIS — O4690 Antepartum hemorrhage, unspecified, unspecified trimester: Secondary | ICD-10-CM

## 2011-08-03 DIAGNOSIS — A499 Bacterial infection, unspecified: Secondary | ICD-10-CM

## 2011-08-03 DIAGNOSIS — O2390 Unspecified genitourinary tract infection in pregnancy, unspecified trimester: Secondary | ICD-10-CM

## 2011-08-03 DIAGNOSIS — N760 Acute vaginitis: Secondary | ICD-10-CM

## 2011-08-03 LAB — RH IG WORKUP (INCLUDES ABO/RH)

## 2011-08-03 NOTE — Progress Notes (Signed)
  IUP @ [redacted] weeks gestation with vag bleeding.   Exam Heart RRR, LCTAB. Abd soft nontender. No vag bleeding @ present.    Pt states no bleeding since yesterday and at that time it was pink in nature. Denies any pain.  A: IUP at 35 wks vag bleeding, probable mild abruption resolving.  P: If no further bleeding possible d/c in am.

## 2011-08-04 LAB — WET PREP, GENITAL

## 2011-08-04 MED ORDER — METRONIDAZOLE 500 MG PO TABS
500.0000 mg | ORAL_TABLET | Freq: Two times a day (BID) | ORAL | Status: DC
  Administered 2011-08-04 – 2011-08-08 (×8): 500 mg via ORAL
  Filled 2011-08-04 (×9): qty 1

## 2011-08-04 NOTE — Progress Notes (Signed)
Called to bedside as pt complained of increasing vaginal itching and foul smelling discharge.  She has had BV in the past and is concerned that may have it again.  Wet prep obtained showing moderate WBC and moderate Clue cells. Will start patient on metronidazole 500mg  BID for 7 days  Delton See MD

## 2011-08-04 NOTE — Progress Notes (Signed)
UR chart review completed.  

## 2011-08-04 NOTE — Progress Notes (Signed)
  FACULTY PRACTICE ANTEPARTUM(COMPREHENSIVE) NOTE  Erica [name] is a 25 y.o. [id number] at [redacted]w[redacted]d  who is admitted for Vaginal Bleeding.   Length of Stay:  3  Days  Subjective: Patient reports the fetal movement as active. Patient reports uterine contraction  activity as none. Patient reports  vaginal bleeding as Spotting at 5 a.m. yesterday. Patient describes fluid per vagina as None.  Vitals:  Blood pressure 124/56, pulse 90, temperature 97.9 F (36.6 C), temperature source Oral, resp. rate 18, height 5\' 3"  (1.6 m), weight 107.775 kg (237 lb 9.6 oz), last menstrual period 11/23/2010. Physical Examination: Abdominall Exam:  Soft, NT Extremities: Homans sign is negative, no sign of DVT  Membranes:intact  Fetal Monitoring:  Baseline: 145 bpm, Variability: Good {> 6 bpm) and Accelerations: Reactive  Labs:  No results found for this or any previous visit (from the past 24 hour(s)).  Medications:  Scheduled    . docusate sodium  100 mg Oral Daily  . Fluticasone-Salmeterol  1 puff Inhalation Daily  . prenatal vitamin w/FE, FA  1 tablet Oral Daily   I have reviewed the patient's current medications.  ASSESSMENT: Patient Active Problem List  Diagnoses  . OBESITY, UNSPECIFIED  . ASTHMA, PERSISTENT, MILD  . ACROCHORDON  . Supervision of other normal pregnancy  . Third trimester bleeding, antepartum    PLAN: 25 yo G3P2002 at 35 wks and 1 day with vaginal bleeding.  Last evidence of bright red blood on tissue was approx 24 hrs ago.  Pt has received rhogam this admission.  Will continue to observe.  Erica [name] H. 08/04/2011,6:49 AM

## 2011-08-05 DIAGNOSIS — O26879 Cervical shortening, unspecified trimester: Secondary | ICD-10-CM | POA: Diagnosis present

## 2011-08-05 NOTE — Progress Notes (Signed)
  S: Denies any vag bleeding, pain or ROM.  O: VVS, heart RRR, LCTAB, FHR tracing reviewed and reassuring. Abd soft and nontender.  A: IUP @ 35 1, stable maternal fetal unit S/P vag bleeding.  P: continue monitoring and close obs.

## 2011-08-06 NOTE — Progress Notes (Signed)
  She has no contractions, good fetal movement. Scant pink d/c noted Filed Vitals:   08/06/11 0641  BP: 105/56  Pulse: 92  Temp:   Resp:    Normal affect, NAD Abd obese, gravid, not tender  Ext no swelling not tender  Imp: [redacted]w[redacted]d Vaginal bleeding hosp day 5 Plan Observe for bleeding

## 2011-08-07 ENCOUNTER — Encounter: Payer: Medicaid Other | Admitting: Family Medicine

## 2011-08-07 NOTE — Progress Notes (Signed)
UR Chart review completed.  

## 2011-08-07 NOTE — Progress Notes (Signed)
  FACULTY PRACTICE ANTEPARTUM(COMPREHENSIVE) NOTE  Erica [name] is a 25 y.o. [id number] at [redacted]w[redacted]d by early ultrasound who is admitted for bleeding.   Fetal presentation is cephalic. Length of Stay:  6  Days  Subjective: No bleeding at all X 24 hours Patient reports the fetal movement as active. Patient reports uterine contraction  activity as none. Patient reports  vaginal bleeding as none. Patient describes fluid per vagina as None.  Vitals:  Blood pressure 136/64, pulse 82, temperature 98.2 F (36.8 C), temperature source Oral, resp. rate 18, height 5\' 3"  (1.6 m), weight 107.775 kg (237 lb 9.6 oz), last menstrual period 11/23/2010. Physical Examination:  Mental status - alert, oriented to person, place, and time.  HRR&R, LCTAB, abd soft, nontender.  No contractions Fundal Height:  size equals dates Pelvic Exam:  No blood on pad Cervical Exam: Not evaluated. and found to be / / and fetal presentation is . Extremities: extremities normal, atraumatic, no cyanosis or edema with DTRs 2+ bilaterally Membranes:intact  Fetal Monitoring:  Baseline: 130 bpm, Variability: Good {> 6 bpm), Accelerations: Reactive and Decelerations: Absent  Labs:  No results found for this or any previous visit (from the past 24 hour(s)).  Imaging Studies:     Currently EPIC will not allow sonographic studies to automatically populate into notes.  In the meantime, copy and paste results into note or free text.  Medications:  Scheduled    . docusate sodium  100 mg Oral Daily  . Fluticasone-Salmeterol  1 puff Inhalation Daily  . metroNIDAZOLE  500 mg Oral Q12H  . prenatal vitamin w/FE, FA  1 tablet Oral Daily   I have reviewed the patient's current medications.  ASSESSMENT: Patient Active Problem List  Diagnoses  . Short cervix affecting pregnancy    PLAN: Consider d/c tomorrow if no bleeding  CRESENZO-DISHMAN,Shauntel Prest 08/07/2011,8:03 AM

## 2011-08-08 ENCOUNTER — Telehealth: Payer: Self-pay | Admitting: Family Medicine

## 2011-08-08 DIAGNOSIS — O4590 Premature separation of placenta, unspecified, unspecified trimester: Secondary | ICD-10-CM

## 2011-08-08 MED ORDER — METRONIDAZOLE 500 MG PO TABS: 500.0000 mg | ORAL_TABLET | Freq: Two times a day (BID) | ORAL | Status: AC

## 2011-08-08 NOTE — Telephone Encounter (Signed)
Patient dropped off FMLA papers to be filled out.  Please fax when completed.  °

## 2011-08-08 NOTE — Telephone Encounter (Signed)
Placed in Dr. Melina Modena box for completion.

## 2011-08-08 NOTE — Discharge Summary (Signed)
  Obstetric Discharge Summary Reason for Admission: third trimester bleeding Prenatal Procedures: none Intrapartum Procedures: n/a Postpartum Procedures: n/a Complications-Operative and Postpartum: n/a   This patient has no babies on file.  H/H: Lab Results  Component Value Date/Time   HGB 11.0* 08/01/2011 11:15 AM   HCT 32.5* 08/01/2011 11:15 AM      Discharge Diagnoses: Antepartum bleeding  Discharge Information: Date: 07/03/2011 Activity: modified bed rest; okay to get up to bathroom and for meals Diet: routine Medications: PNV and 3 additional days of Flagyl Breast feeding: n/a Condition: stable Instructions: Please do not return to work and stay off your feet except for meals and bathroom Discharge to: home  Follow up with Dr. Lula Olszewski at [facility] next week, call [phone] for appointment   [name], Erica Looman 08/08/2011,10:30 AM

## 2011-08-08 NOTE — Progress Notes (Signed)
  ANTEPARTUM(COMPREHENSIVE) NOTE  Erica [name] is a 25 y.o. G3P2002 at [redacted]w[redacted]d by LMP, early ultrasound who is admitted for third trimester bleeding.   Fetal presentation is cephalic. Length of Stay:  7  Days  Subjective: Pt doing well; ready to go home; no acute concerns Patient reports the fetal movement as active. Patient reports uterine contraction  activity as none. Patient reports  vaginal bleeding as none. Patient describes fluid per vagina as None.  Vitals:  Blood pressure 148/70, pulse 94, temperature 98.3 F (36.8 C), temperature source Oral, resp. rate 18, height 5\' 3"  (1.6 m), weight 237 lb 9.6 oz (107.775 kg), last menstrual period 11/23/2010. Physical Examination:  General appearance - alert, well appearing, and in no distress Chest - clear to auscultation, no wheezes, rales or rhonchi, symmetric air entry Heart - normal rate, regular rhythm, normal S1, S2, no murmurs, rubs, clicks or gallops Fundal Height:  size equals dates Pelvic Exam:  examination not indicated Extremities: extremities normal, atraumatic, no cyanosis or edema Membranes:intact  Fetal Monitoring:  Baseline: 150 bpm, Variability: moderate, Accelerations: Reactive and Decelerations: Absent  Labs:  No results found for this or any previous visit (from the past 24 hour(s)).  Imaging Studies:    Marland Kitchen  Medications:  Scheduled    . docusate sodium  100 mg Oral Daily  . Fluticasone-Salmeterol  1 puff Inhalation Daily  . metroNIDAZOLE  500 mg Oral Q12H  . prenatal vitamin w/FE, FA  1 tablet Oral Daily   I have reviewed the patient's current medications.  ASSESSMENT: Patient Active Problem List  Diagnoses  . Short cervix affecting pregnancy    PLAN: Will discharge home today Pt to complete 7 day course of Flagyl for BV Follow up at [facility] with Dr. Lula Olszewski next week  BOOTH, Jamarii Banks 08/08/2011,10:17 AM

## 2011-08-11 NOTE — Discharge Summary (Signed)
  Obstetric Discharge Summary Reason for Admission: third trimester bleeding Prenatal Procedures: none Intrapartum Procedures: n/a Postpartum Procedures: n/a Complications-Operative and Postpartum: n/a   This patient has no babies on file.  H/H: Lab Results  Component Value Date/Time   HGB 11.0* 08/01/2011 11:15 AM   HCT 32.5* 08/01/2011 11:15 AM      Discharge Diagnoses: Antepartum bleeding, pt presented with vaginal bleeding.  Pt was observed for 7 days and the bleeding resolved.  All antepartum testing was reassuring.  She was d/c'd in stable condition with close clinic follow up. All workup was negative and she did receive an additional dose of rhogam due to being Rh negative with acute bleeding.    Discharge Information: Date: 07/03/2011 Activity: modified bed rest; okay to get up to bathroom and for meals Diet: routine Medications: PNV and 3 additional days of Flagyl Breast feeding: n/a Condition: stable Instructions: Please do not return to work and stay off your feet except for meals and bathroom Discharge to: home  Follow up with Dr. Lula Olszewski at [facility] next week, call [phone] for appointment   Erica [name]  12:01 PM  08/11/2011

## 2011-08-13 NOTE — Telephone Encounter (Addendum)
Form completed by Dr. Lula Olszewski. Patient had requested this form be faxed to her employer but will need a signed release of records before we can do this.  Tried to call patient but phone is out of  service.   Sent faxed note to employer to ask them to have patient call us. Patient has appointment here tomorrow also.  Form is on desk in Lynn's office.

## 2011-08-14 ENCOUNTER — Ambulatory Visit (INDEPENDENT_AMBULATORY_CARE_PROVIDER_SITE_OTHER): Payer: Medicaid Other | Admitting: Family Medicine

## 2011-08-14 DIAGNOSIS — O26879 Cervical shortening, unspecified trimester: Secondary | ICD-10-CM

## 2011-08-14 DIAGNOSIS — Z3480 Encounter for supervision of other normal pregnancy, unspecified trimester: Secondary | ICD-10-CM

## 2011-08-14 NOTE — Progress Notes (Signed)
Nea presents for follow up.  She is [redacted]w[redacted]d.  She was admitted to [facility] on 8/10 for vaginal bleeding.  She had and [place] that showed a shortened cervix but no other abnormalities. She received Rhogam in the hospital due to the bleeding. She was monitored on the antenatal unit until 8/17.  She was discharged home on modified bed rest.  Today she reports no further vaginal bleeding, no leakage of fluid, some occasional minor cramping pelvic pains, and no other complaints.  -GBS and GC/Chlamydia today.  Will do Urine GC/Chlamydia, deferred speculum exam due to recent bleeding.  - plans to breast feed -still deciding about birthcontrol -pt received Rhogam in hospital, do not need to repeat until delivery -anticipate NSVD -Labor precautions reviewed. -continue modified bed rest, FMLA forms filled out.  -follow up in one week.

## 2011-08-14 NOTE — Patient Instructions (Signed)
It was good to see you. 

## 2011-08-14 NOTE — Telephone Encounter (Signed)
Patient signed ROI. Forms faxed to  Employer , [employer], attention [employer].

## 2011-08-15 LAB — GC/CHLAMYDIA PROBE AMP, URINE
Chlamydia, Swab/Urine, PCR: NEGATIVE
GC Probe Amp, Urine: NEGATIVE

## 2011-08-17 LAB — CULTURE, BETA STREP (GROUP B ONLY)

## 2011-08-22 ENCOUNTER — Telehealth: Payer: Self-pay | Admitting: Family Medicine

## 2011-08-22 ENCOUNTER — Ambulatory Visit (INDEPENDENT_AMBULATORY_CARE_PROVIDER_SITE_OTHER): Payer: Medicaid Other | Admitting: Family Medicine

## 2011-08-22 DIAGNOSIS — Z3480 Encounter for supervision of other normal pregnancy, unspecified trimester: Secondary | ICD-10-CM

## 2011-08-22 NOTE — Telephone Encounter (Signed)
Patient dropped off form to be filled out for disability.  Please call her when completed.

## 2011-08-22 NOTE — Patient Instructions (Signed)
You are 37 weeks and 5 days!  Please go to the [facility] if you think you are in labor, if you think your water broke, or if you have any vaginal bleeding.    Please make an appointment to see me in one week.

## 2011-08-22 NOTE — Progress Notes (Signed)
Erica [name] presents for follow up.  She is [redacted]w[redacted]d. She has no complaints but does report some intermittent pressure/pain in her pelvis, but no real contractions, no bleeding, and no leakage of fluid.   -GBS and GC/Chlamydia done last week, all negative - plans to breast feed -Nextplanon for birth control.  -pt received Rhogam in hospital, do not need to repeat until delivery -anticipate NSVD -Labor precautions reviewed. -continue modified bed rest.  -follow up in one week.

## 2011-08-26 NOTE — Telephone Encounter (Signed)
Form placed in pcp's box for completion. Asked to return form to RN team when completed.Erica [name] [place]

## 2011-08-27 ENCOUNTER — Encounter: Payer: Medicaid Other | Admitting: Family Medicine

## 2011-08-27 ENCOUNTER — Ambulatory Visit (INDEPENDENT_AMBULATORY_CARE_PROVIDER_SITE_OTHER): Payer: Medicaid Other | Admitting: Family Medicine

## 2011-08-27 DIAGNOSIS — Z6791 Unspecified blood type, Rh negative: Secondary | ICD-10-CM | POA: Insufficient documentation

## 2011-08-27 DIAGNOSIS — O360990 Maternal care for other rhesus isoimmunization, unspecified trimester, not applicable or unspecified: Secondary | ICD-10-CM

## 2011-08-27 DIAGNOSIS — Z3480 Encounter for supervision of other normal pregnancy, unspecified trimester: Secondary | ICD-10-CM

## 2011-08-27 NOTE — Progress Notes (Signed)
Erica [name] presents for follow up.  She is [redacted]w[redacted]d. She has no complaints but does report some intermittent pressure/pain in her pelvis, but no real contractions, no bleeding, and no leakage of fluid.   -GBS negative - plans to breast feed -Nextplanon for birth control.  -pt received Rhogam in hospital, do not need to repeat until delivery -anticipate NSVD -Labor precautions reviewed. -continue modified bed rest 2/2 hospitalization for vaginal bleeding.  -follow up in one week.

## 2011-08-28 NOTE — Telephone Encounter (Signed)
Patient notified that form is ready to pick up. 

## 2011-09-03 ENCOUNTER — Ambulatory Visit (INDEPENDENT_AMBULATORY_CARE_PROVIDER_SITE_OTHER): Payer: Medicaid Other | Admitting: Family Medicine

## 2011-09-03 DIAGNOSIS — O360990 Maternal care for other rhesus isoimmunization, unspecified trimester, not applicable or unspecified: Secondary | ICD-10-CM

## 2011-09-03 DIAGNOSIS — O26879 Cervical shortening, unspecified trimester: Secondary | ICD-10-CM

## 2011-09-03 DIAGNOSIS — O26899 Other specified pregnancy related conditions, unspecified trimester: Secondary | ICD-10-CM

## 2011-09-03 DIAGNOSIS — Z3480 Encounter for supervision of other normal pregnancy, unspecified trimester: Secondary | ICD-10-CM

## 2011-09-03 NOTE — Progress Notes (Signed)
Erica [name] presents for follow up.  She is [redacted]w[redacted]d. She has no complaints but does report some increased pressure in her pelvis, but still denies contractions, no bleeding, and no leakage of fluid.   -GBS negative - plans to breast feed -Nextplanon for birth control.  -pt received Rhogam in hospital, do not need to repeat until delivery -anticipate NSVD -Labor precautions reviewed. - pt with no further vaginal bleeding, now term, will d/c modified bed rest.  -follow up in one week, will schedule induction if patient does not go into labor by 41 weeks.

## 2011-09-03 NOTE — Patient Instructions (Signed)
It was good to see you, you are almost to your due date.  Your cervix has thinned out some, and is open 1.5 cm.  If you go into labor, please go to [facility] hospital, and ask them to call me when you get there.  Please make an appointment to see me in about one week.

## 2011-09-04 ENCOUNTER — Encounter (HOSPITAL_COMMUNITY): Payer: Self-pay | Admitting: *Deleted

## 2011-09-04 ENCOUNTER — Inpatient Hospital Stay (HOSPITAL_COMMUNITY)
Admission: AD | Admit: 2011-09-04 | Discharge: 2011-09-04 | Disposition: A | Discharge status: Home / Self Care | Payer: Medicaid Other | Source: Ambulatory Visit | Attending: Obstetrics & Gynecology | Admitting: Obstetrics & Gynecology

## 2011-09-04 DIAGNOSIS — O26899 Other specified pregnancy related conditions, unspecified trimester: Secondary | ICD-10-CM

## 2011-09-04 DIAGNOSIS — O99891 Other specified diseases and conditions complicating pregnancy: Secondary | ICD-10-CM | POA: Insufficient documentation

## 2011-09-04 DIAGNOSIS — Z3480 Encounter for supervision of other normal pregnancy, unspecified trimester: Secondary | ICD-10-CM

## 2011-09-04 DIAGNOSIS — O4693 Antepartum hemorrhage, unspecified, third trimester: Secondary | ICD-10-CM

## 2011-09-04 HISTORY — DX: Gestational (pregnancy-induced) hypertension without significant proteinuria, unspecified trimester: O13.9

## 2011-09-04 HISTORY — DX: Anemia, unspecified: D64.9

## 2011-09-04 NOTE — ED Provider Notes (Signed)
History     Chief Complaint  Patient presents with  . Vaginal Discharge   HPI Erica [name] is a 25 year old G3P2002 at 48.4 by ultrasound who presents with r/o rupture of membranes.  Patient states she had some wetness on her thighs this morning when she woke up and noted some mucous discharge with a few red streaks in it. Denies contractions, vaginal bleeding.  Reports good fetal movement.  Prenatal care with Dr. Lula Olszewski at [facility].  OB History    Grav Para Term Preterm Abortions TAB SAB Ect Mult Living   3 2 2  0 0 0 0 0 0 2      Past Medical History  Diagnosis Date  . Asthma   . Rh incompatibility   . Varicosities   . Anemia   . Pregnancy induced hypertension     History reviewed. No pertinent past surgical history.  Family History  Problem Relation Age of Onset  . Asthma Mother   . Hypertension Mother   . Hypertension Father   . Drug abuse Father   . Hepatitis Daughter     History  Substance Use Topics  . Smoking status: Never Smoker   . Smokeless tobacco: Never Used  . Alcohol Use: No    Allergies: No Known Allergies  Prescriptions prior to admission  Medication Sig Dispense Refill  . albuterol (PROVENTIL HFA;VENTOLIN HFA) 108 (90 BASE) MCG/ACT inhaler Inhale 1-2 puffs into the lungs as needed. As needed for shortness of breath       . Fluticasone-Salmeterol (ADVAIR DISKUS) 250-50 MCG/DOSE AEPB Inhale 1 puff into the lungs daily.       . prenatal vitamin w/FE, FA (PRENATAL 1 + 1) 27-1 MG TABS Take 1 tablet by mouth daily.          Review of Systems  Constitutional: Negative.   HENT: Negative.   Eyes: Negative.   Respiratory: Negative.   Cardiovascular: Negative.   Gastrointestinal: Negative.   Genitourinary: Negative.   Skin: Negative.    Physical Exam   Blood pressure 129/78, pulse 85, temperature 99 F (37.2 C), temperature source Oral, resp. rate 20, height 5\' 5"  (1.651 m), weight 245 lb 3.2 oz (111.222 kg), last menstrual period 11/23/2010, SpO2  97.00%.  Physical Exam  Constitutional: She is oriented to person, place, and time. She appears well-developed and well-nourished. No distress.  HENT:  Head: Normocephalic and atraumatic.  Mouth/Throat: Oropharynx is clear and moist.  Eyes: No scleral icterus.  Neck: Normal range of motion. Neck supple.  Cardiovascular: Normal rate, regular rhythm, normal heart sounds and intact distal pulses.  Exam reveals no gallop.   No murmur heard. Respiratory: Effort normal and breath sounds normal. She has no wheezes. She has no rales.  GI: Bowel sounds are normal.       Gravid with size consistent with dates.  Vertex by Leopold's.   Genitourinary: Vagina normal.  Musculoskeletal: She exhibits no edema.  Neurological: She is alert and oriented to person, place, and time.  Skin: Skin is warm and dry. No rash noted.  Psychiatric: She has a normal mood and affect.   FHT: 150, moderate variability, accels present, decels absent Contractions: none Dilation: 1.5 Effacement (%): 30 Cervical Position: Posterior Exam by:: Dr. Elwyn Reach   MAU Course  Procedures  Ferning test: negative  Assessment and Plan  25 year old G3P2002 at 39.4 by ultrasound with likely passes mucous plug.  Not in active labor, no rupture of membrane. Will discharge home with  labor precautions.  BOOTH, Tevis Dunavan 09/04/2011, 11:47 AM

## 2011-09-04 NOTE — Progress Notes (Signed)
Pt states she had wetness on her thighs this a.m., had mucus discharge with red streaks when she wiped.

## 2011-09-04 NOTE — Progress Notes (Signed)
Pt states she had a thick mucus discharge this am with streaks of blood. Having some mild painless contractions on and off. Reports good fetal movement.

## 2011-09-04 NOTE — Progress Notes (Signed)
Encounter addended by: Lesly Dukes, MD on: 09/04/2011  1:15 PM<BR>     Documentation filed: Charges VN

## 2011-09-04 NOTE — Progress Notes (Signed)
Encounter addended by: Greig Right, CNM on: 09/04/2011  8:09 AM<BR>     Documentation filed: Charges VN

## 2011-09-05 NOTE — Progress Notes (Signed)
Encounter addended by: Scheryl Darter, MD on: 09/05/2011 12:11 PM<BR>     Documentation filed: Charges VN

## 2011-09-08 ENCOUNTER — Ambulatory Visit (INDEPENDENT_AMBULATORY_CARE_PROVIDER_SITE_OTHER): Payer: Medicaid Other | Admitting: Family Medicine

## 2011-09-08 VITALS — BP 133/87 | Wt 250.4 lb

## 2011-09-08 DIAGNOSIS — Z23 Encounter for immunization: Secondary | ICD-10-CM

## 2011-09-08 DIAGNOSIS — Z3480 Encounter for supervision of other normal pregnancy, unspecified trimester: Secondary | ICD-10-CM

## 2011-09-08 LAB — POCT URINALYSIS DIPSTICK: Bilirubin, UA: NEGATIVE

## 2011-09-08 NOTE — Progress Notes (Signed)
Addended by: [place], Aram Domzalski on: 09/08/2011 05:21 PM   Modules accepted: Orders

## 2011-09-08 NOTE — Progress Notes (Signed)
Erica [name] presents for follow up.  She is [redacted]w[redacted]d. She reports she went to [facility] over the weekend due to scant bleeding, was told it was the mucus plug, and she has not felt contractions.  Otherwise no complaints, denies headaches, vision changes.   25 year old [id number]@ [redacted]w[redacted]d - pt with some swelling and blood pressure higher than last several visits, will check UA for protein - Pt now post-dates, will schedule NST/BPP at [facility] for fetal monitoring - Scheduled induction on 09/16/11 when pt reaches [redacted]w[redacted]d, labor precautions reviewed.  - Flu Shot today. -GBS negative - plans to breast feed -Nextplanon for birth control.  -pt received Rhogam around 35 weeks when admitted 2/2 Vaginal bleeding -anticipate NSVD -follow up in one week

## 2011-09-08 NOTE — Patient Instructions (Signed)
You are 40 weeks and 1 day, so one day past your due date.  We will start monitoring the baby - and you are scheduled for a NST/BPP tomorrow at [facility].  I have scheduled your induction for Tuesday, 09/16/11, @ 7:00 am.  If you do not go into labor before that day, we will induce you then.

## 2011-09-09 ENCOUNTER — Inpatient Hospital Stay (HOSPITAL_COMMUNITY)
Admission: AD | Admit: 2011-09-09 | Discharge: 2011-09-09 | Disposition: A | Discharge status: Home / Self Care | Payer: Medicaid Other | Source: Ambulatory Visit | Attending: Obstetrics & Gynecology | Admitting: Obstetrics & Gynecology

## 2011-09-09 ENCOUNTER — Encounter (HOSPITAL_COMMUNITY): Payer: Self-pay | Admitting: *Deleted

## 2011-09-09 ENCOUNTER — Ambulatory Visit (INDEPENDENT_AMBULATORY_CARE_PROVIDER_SITE_OTHER): Payer: Medicaid Other | Admitting: *Deleted

## 2011-09-09 VITALS — BP 154/86

## 2011-09-09 DIAGNOSIS — O360990 Maternal care for other rhesus isoimmunization, unspecified trimester, not applicable or unspecified: Secondary | ICD-10-CM

## 2011-09-09 DIAGNOSIS — O99891 Other specified diseases and conditions complicating pregnancy: Secondary | ICD-10-CM | POA: Insufficient documentation

## 2011-09-09 DIAGNOSIS — Z6791 Unspecified blood type, Rh negative: Secondary | ICD-10-CM

## 2011-09-09 DIAGNOSIS — R030 Elevated blood-pressure reading, without diagnosis of hypertension: Secondary | ICD-10-CM | POA: Insufficient documentation

## 2011-09-09 DIAGNOSIS — O133 Gestational [pregnancy-induced] hypertension without significant proteinuria, third trimester: Secondary | ICD-10-CM

## 2011-09-09 DIAGNOSIS — Z3480 Encounter for supervision of other normal pregnancy, unspecified trimester: Secondary | ICD-10-CM

## 2011-09-09 DIAGNOSIS — O480 Post-term pregnancy: Secondary | ICD-10-CM

## 2011-09-09 DIAGNOSIS — O139 Gestational [pregnancy-induced] hypertension without significant proteinuria, unspecified trimester: Secondary | ICD-10-CM

## 2011-09-09 DIAGNOSIS — O4693 Antepartum hemorrhage, unspecified, third trimester: Secondary | ICD-10-CM

## 2011-09-09 LAB — COMPREHENSIVE METABOLIC PANEL
Albumin: 2.3 g/dL — ABNORMAL LOW (ref 3.5–5.2)
Alkaline Phosphatase: 109 U/L (ref 39–117)
BUN: 7 mg/dL (ref 6–23)
CO2: 25 mEq/L (ref 19–32)
Chloride: 104 mEq/L (ref 96–112)
Glucose, Bld: 114 mg/dL — ABNORMAL HIGH (ref 70–99)
Potassium: 3 mEq/L — ABNORMAL LOW (ref 3.5–5.1)
Total Bilirubin: 0.2 mg/dL — ABNORMAL LOW (ref 0.3–1.2)

## 2011-09-09 LAB — CBC
HCT: 31.7 % — ABNORMAL LOW (ref 36.0–46.0)
Hemoglobin: 11 g/dL — ABNORMAL LOW (ref 12.0–15.0)
RBC: 3.77 MIL/uL — ABNORMAL LOW (ref 3.87–5.11)
WBC: 6.5 10*3/uL (ref 4.0–10.5)

## 2011-09-09 LAB — PROTEIN / CREATININE RATIO, URINE: Creatinine, Urine: 187.59 mg/dL

## 2011-09-09 NOTE — Progress Notes (Addendum)
P = 103  Pt here for NST/AFI only. Pt denies H/A or visual disturbances. All results called to Dr. Debroah Loop and Dr. Lula Olszewski.  Pt sent to MAU for further evaluation of BP following fetal testing.  ( Notes entered after pt encounter due to [facility] system inoperable @ time of pt visit. ) NST reviewed in clinic, reactive no decels

## 2011-09-09 NOTE — ED Provider Notes (Signed)
See full note by S. Holbrook DO.  Pt feels well- denies H/A or other pre-e symptoms. Has been awaiting pro/cr ratio which is now back- 0.09. FHR tracing has remained reactive and reassuring with baseline 145 and + accels, no decels and rare contractions. CBC and CMET rev'd by Dr Natale Milch incl plts of 113 noted, and phys assess performed by her as well. Will d/c home with preeclampsia precautions and instructions to return on Friday to [facility] for NST and BP eval, or sooner if symptomatic. Cam Hai 09/09/2011 3:21 PM

## 2011-09-09 NOTE — Progress Notes (Signed)
Pt was sent from Antenatal Testing for evaluation of elevated BP. Pt denies any pain, leaking or bleeding and reports good fetal movement.

## 2011-09-09 NOTE — Progress Notes (Signed)
Pt sent to mau for pih eval, denies any headache dizziness blurred vision or increased swelling.  Denies any bleeding or lof.  + FM

## 2011-09-10 ENCOUNTER — Telehealth: Payer: Self-pay | Admitting: Family Medicine

## 2011-09-10 NOTE — Telephone Encounter (Signed)
Spoke with patient and she states she started leaking fluid this AM , not a lot just enough to wet panties. She states the baby is moving , she is not having contractions. Consulted with Dr. Mauricio Po and he states patient needs to go to MAU now for evaluation.

## 2011-09-10 NOTE — Progress Notes (Signed)
Encounter addended by: Scheryl Darter, MD on: 09/10/2011 10:32 AM<BR>     Documentation filed: Charges VN

## 2011-09-10 NOTE — Telephone Encounter (Signed)
Message left on voicemail to return call.

## 2011-09-10 NOTE — Telephone Encounter (Signed)
Erica [name] is 46wks and is seeing some strange looking fluid that she is concerned about.  Don't think her water has broken but never saw somehting like this before.  Want to know [facility] not

## 2011-09-12 ENCOUNTER — Telehealth (HOSPITAL_COMMUNITY): Payer: Self-pay | Admitting: *Deleted

## 2011-09-12 ENCOUNTER — Ambulatory Visit (INDEPENDENT_AMBULATORY_CARE_PROVIDER_SITE_OTHER): Payer: Medicaid Other | Admitting: *Deleted

## 2011-09-12 DIAGNOSIS — O480 Post-term pregnancy: Secondary | ICD-10-CM

## 2011-09-12 DIAGNOSIS — O139 Gestational [pregnancy-induced] hypertension without significant proteinuria, unspecified trimester: Secondary | ICD-10-CM

## 2011-09-12 NOTE — Telephone Encounter (Signed)
Preadmission screen  

## 2011-09-12 NOTE — Progress Notes (Signed)
P = 102   NST only today.  Pt denies H/A or visual disturbances.  BP re-check = 149/81.   Next appt @ [facility] for prenatal visit, then IOL on 09/16/11 @ 0700.

## 2011-09-15 ENCOUNTER — Ambulatory Visit (INDEPENDENT_AMBULATORY_CARE_PROVIDER_SITE_OTHER): Payer: Medicaid Other | Admitting: Family Medicine

## 2011-09-15 DIAGNOSIS — Z3480 Encounter for supervision of other normal pregnancy, unspecified trimester: Secondary | ICD-10-CM

## 2011-09-15 NOTE — Progress Notes (Signed)
Today pt denies abd pain, HA or change in vision.  Denies loss of fluid.  No contractions.  BP lower than when checked at MAU with no new symptoms.  Induction scheduled for tomorrow.  Advised to call or go to MAU is stops feeling movement

## 2011-09-16 ENCOUNTER — Inpatient Hospital Stay (HOSPITAL_COMMUNITY)
Admission: RE | Admit: 2011-09-16 | Discharge: 2011-09-19 | DRG: 774 | Disposition: A | Discharge status: Home / Self Care | Payer: Medicaid Other | Source: Ambulatory Visit | Attending: Obstetrics & Gynecology | Admitting: Obstetrics & Gynecology

## 2011-09-16 ENCOUNTER — Encounter (HOSPITAL_COMMUNITY): Payer: Self-pay

## 2011-09-16 DIAGNOSIS — O4693 Antepartum hemorrhage, unspecified, third trimester: Secondary | ICD-10-CM

## 2011-09-16 DIAGNOSIS — O1400 Mild to moderate pre-eclampsia, unspecified trimester: Secondary | ICD-10-CM

## 2011-09-16 DIAGNOSIS — O139 Gestational [pregnancy-induced] hypertension without significant proteinuria, unspecified trimester: Secondary | ICD-10-CM | POA: Diagnosis present

## 2011-09-16 DIAGNOSIS — Z6791 Unspecified blood type, Rh negative: Secondary | ICD-10-CM

## 2011-09-16 DIAGNOSIS — O9902 Anemia complicating childbirth: Secondary | ICD-10-CM | POA: Diagnosis present

## 2011-09-16 DIAGNOSIS — O480 Post-term pregnancy: Secondary | ICD-10-CM

## 2011-09-16 DIAGNOSIS — Z3480 Encounter for supervision of other normal pregnancy, unspecified trimester: Secondary | ICD-10-CM

## 2011-09-16 DIAGNOSIS — D649 Anemia, unspecified: Secondary | ICD-10-CM | POA: Diagnosis present

## 2011-09-16 LAB — COMPREHENSIVE METABOLIC PANEL
ALT: 15 U/L (ref 0–35)
Alkaline Phosphatase: 123 U/L — ABNORMAL HIGH (ref 39–117)
BUN: 10 mg/dL (ref 6–23)
CO2: 24 mEq/L (ref 19–32)
Chloride: 105 mEq/L (ref 96–112)
GFR calc Af Amer: >60 mL/min (ref >60)
Glucose, Bld: 111 mg/dL — ABNORMAL HIGH (ref 70–99)
Potassium: 2.7 mEq/L — CL (ref 3.5–5.1)
Total Bilirubin: 0.3 mg/dL (ref 0.3–1.2)

## 2011-09-16 LAB — CBC
HCT: 32 % — ABNORMAL LOW (ref 36.0–46.0)
Hemoglobin: 11 g/dL — ABNORMAL LOW (ref 12.0–15.0)
MCV: 84.4 fL (ref 78.0–100.0)
RBC: 3.79 MIL/uL — ABNORMAL LOW (ref 3.87–5.11)
WBC: 5 10*3/uL (ref 4.0–10.5)

## 2011-09-16 LAB — PROTEIN / CREATININE RATIO, URINE: Creatinine, Urine: 178.6 mg/dL

## 2011-09-16 MED ORDER — LABETALOL HCL 5 MG/ML IV SOLN
10.0000 mg | Freq: Once | INTRAVENOUS | Status: AC
  Administered 2011-09-16: 10 mg via INTRAVENOUS

## 2011-09-16 MED ORDER — OXYCODONE-ACETAMINOPHEN 5-325 MG PO TABS: 2.0000 | ORAL_TABLET | ORAL | Status: DC | PRN

## 2011-09-16 MED ORDER — IBUPROFEN 600 MG PO TABS: 600.0000 mg | ORAL_TABLET | Freq: Four times a day (QID) | ORAL | Status: DC | PRN

## 2011-09-16 MED ORDER — LIDOCAINE HCL (PF) 1 % IJ SOLN
30.0000 mL | INTRAMUSCULAR | Status: DC | PRN
  Filled 2011-09-16 (×2): qty 30

## 2011-09-16 MED ORDER — ONDANSETRON HCL 4 MG/2ML IJ SOLN: 4.0000 mg | Freq: Four times a day (QID) | INTRAMUSCULAR | Status: DC | PRN

## 2011-09-16 MED ORDER — FLUTICASONE-SALMETEROL 250-50 MCG/DOSE IN AEPB
1.0000 | INHALATION_SPRAY | Freq: Every day | RESPIRATORY_TRACT | Status: DC
  Administered 2011-09-16: 1 via RESPIRATORY_TRACT
  Filled 2011-09-16: qty 14

## 2011-09-16 MED ORDER — TERBUTALINE SULFATE 1 MG/ML IJ SOLN: 0.2500 mg | Freq: Once | INTRAMUSCULAR | Status: AC | PRN

## 2011-09-16 MED ORDER — LABETALOL HCL 5 MG/ML IV SOLN
INTRAVENOUS | Status: AC
  Filled 2011-09-16: qty 4

## 2011-09-16 MED ORDER — FLEET ENEMA 7-19 GM/118ML RE ENEM: 1.0000 | ENEMA | RECTAL | Status: DC | PRN

## 2011-09-16 MED ORDER — LACTATED RINGERS IV SOLN: 500.0000 mL | INTRAVENOUS | Status: DC | PRN

## 2011-09-16 MED ORDER — OXYTOCIN 20 UNITS IN LACTATED RINGERS INFUSION - SIMPLE
125.0000 mL/h | Freq: Once | INTRAVENOUS | Status: AC
  Administered 2011-09-17: 999 mL/h via INTRAVENOUS

## 2011-09-16 MED ORDER — MAGNESIUM SULFATE BOLUS VIA INFUSION
4.0000 g | Freq: Once | INTRAVENOUS | Status: DC
  Filled 2011-09-16: qty 500

## 2011-09-16 MED ORDER — CITRIC ACID-SODIUM CITRATE 334-500 MG/5ML PO SOLN: 30.0000 mL | ORAL | Status: DC | PRN

## 2011-09-16 MED ORDER — ACETAMINOPHEN 325 MG PO TABS: 650.0000 mg | ORAL_TABLET | ORAL | Status: DC | PRN

## 2011-09-16 MED ORDER — MISOPROSTOL 25 MCG QUARTER TABLET
25.0000 ug | ORAL_TABLET | ORAL | Status: DC | PRN
  Administered 2011-09-16 (×4): 25 ug via VAGINAL
  Filled 2011-09-16: qty 0.25
  Filled 2011-09-16: qty 1
  Filled 2011-09-16 (×3): qty 0.25

## 2011-09-16 MED ORDER — MAGNESIUM SULFATE 40 G IN LACTATED RINGERS - SIMPLE
2.0000 g/h | INTRAVENOUS | Status: AC
  Administered 2011-09-16: 4 g/h via INTRAVENOUS
  Administered 2011-09-17 – 2011-09-18 (×2): 2 g/h via INTRAVENOUS
  Filled 2011-09-16 (×3): qty 500

## 2011-09-16 MED ORDER — LABETALOL HCL 5 MG/ML IV SOLN
INTRAVENOUS | Status: AC
  Administered 2011-09-16: 10 mg
  Filled 2011-09-16: qty 4

## 2011-09-16 MED ORDER — OXYTOCIN BOLUS FROM INFUSION
500.0000 mL | Freq: Once | INTRAVENOUS | Status: DC
  Filled 2011-09-16: qty 500

## 2011-09-16 MED ORDER — LACTATED RINGERS IV SOLN
INTRAVENOUS | Status: DC
  Administered 2011-09-16 – 2011-09-17 (×2): via INTRAVENOUS
  Administered 2011-09-17: 75 mL/h via INTRAVENOUS

## 2011-09-16 MED ORDER — POTASSIUM CHLORIDE CRYS ER 20 MEQ PO TBCR
40.0000 meq | EXTENDED_RELEASE_TABLET | Freq: Two times a day (BID) | ORAL | Status: DC
  Administered 2011-09-16 (×2): 40 meq via ORAL
  Filled 2011-09-16 (×4): qty 2

## 2011-09-16 NOTE — Progress Notes (Signed)
Erica [name] is a 25 y.o. [id number] at [redacted]w[redacted]d by LMP admitted for induction of labor due to Post dates. Due date 09/07/11.  Subjective:   Objective: BP 164/104  Pulse 72  Temp(Src) 98 F (36.7 C) (Oral)  Ht 5\' 4"  (1.626 m)  Wt 254 lb (115.214 kg)  BMI 43.60 kg/m2  LMP 11/23/2010      FHT:  FHR: 150 bpm, variability: moderate,  accelerations:  Present,  decelerations:  Absent UC:   irregular, every 4-7 minutes SVE:   Dilation: Fingertip Effacement (%): 50 Station: -3 Exam by:: j.cox,rn  Labs: Lab Results  Component Value Date   WBC 5.0 09/16/2011   HGB 11.0* 09/16/2011   HCT 32.0* 09/16/2011   MCV 84.4 09/16/2011   PLT 125* 09/16/2011    Assessment / Plan: Induction of labor due to postterm,  progressing well on pitocin  Labor: S/P 2 doses of cytotec, minimal contractions.  Preeclampsia:  Will start magnesium secondary to elevated bp's despite 2 doses of labetolol.  Fetal Wellbeing:  Category I Pain Control:  Labor support without medications and Epidural when in active labor I/D:  n/a Anticipated MOD:  NSVD  Oasis Goehring 09/16/2011, 6:04 PM

## 2011-09-16 NOTE — H&P (Signed)
Attestation of Attending Supervision of Resident: Evaluation and management procedures were performed by the [facility] resident under my supervision.  I have reviewed the resident's note, chart reviewed and agree with management and plan.  Midge Momon A 09/16/2011 12:10 PM

## 2011-09-16 NOTE — H&P (Signed)
Erica [name] is a 25 y.o. female presenting for planned induction of labor for post-dates. Maternal Medical History:  Reason for admission: Reason for Admission:   nauseaFetal activity: Perceived fetal activity is normal.   Last perceived fetal movement was within the past hour.    Prenatal complications: Hypertension (Patient evaluated for preeclampsia. Results negative.).   No pre-eclampsia.   Prenatal Complications - Diabetes: none.    OB History    Grav Para Term Preterm Abortions TAB SAB Ect Mult Living   3 2 2  0 0 0 0 0 0 2     Past Medical History  Diagnosis Date  . Asthma   . Rh incompatibility   . Varicosities   . Anemia   . Pregnancy induced hypertension    No past surgical history on file. Family History: family history includes Asthma in her mother; Drug abuse in her father; Hepatitis in her daughter; and Hypertension in her father and mother. Social History:  reports that she has never smoked. She has never used smokeless tobacco. She reports that she does not drink alcohol or use illicit drugs.  Review of Systems  Constitutional: Negative.   Eyes: Negative for blurred vision and double vision.  Respiratory: Positive for shortness of breath (Patient has a history of asthma, which has been subjectively worse during pregnancy.) and wheezing (Patient has experienced increase in asthma symptoms during pregnancy.). Negative for cough.   Cardiovascular: Positive for leg swelling. Negative for chest pain and palpitations.  Gastrointestinal: Negative for nausea, vomiting, abdominal pain and diarrhea.  Genitourinary: Negative for dysuria, hematuria and flank pain.  Neurological: Negative for dizziness and headaches.  Psychiatric/Behavioral: Negative for depression.      Blood pressure 147/90, pulse 102, temperature 98 F (36.7 C), temperature source Oral, height 5\' 4"  (1.626 m), weight 115.214 kg (254 lb), last menstrual period 11/23/2010. Maternal Exam:    Abdomen: Patient reports no abdominal tenderness. Introitus: not evaluated.   Cervix: not evaluated.   Physical Exam  Constitutional: She is oriented to person, place, and time. She appears well-developed and well-nourished. No distress.  Cardiovascular: Normal rate, regular rhythm, normal heart sounds and intact distal pulses.  Exam reveals no gallop and no friction rub.   No murmur heard. Respiratory: Effort normal and breath sounds normal. No respiratory distress. She has no wheezes. She has no rales.  GI: Bowel sounds are normal.       Gravid  Musculoskeletal: Normal range of motion. She exhibits edema (Bilateral lower extremity edema.). She exhibits no tenderness.  Neurological: She is alert and oriented to person, place, and time.  Skin: Skin is warm and dry. She is not diaphoretic.  Psychiatric: She has a normal mood and affect. Her behavior is normal. Judgment and thought content normal.    Prenatal labs: ABO, Rh: --/--/A NEG, A NEG, A NEG (08/10 1115) Antibody: NEG (08/10 1115) Rubella: 37.5 (03/27 1026) RPR: NON REAC (06/14 1448)  HBsAg: NEGATIVE (03/27 1026)  HIV: NON REACTIVE (06/14 1448)  GBS: Negative (09/17 0000)   Assessment/Plan: 25 year old female G3P2002. Induction of labor for post-dates.   Janeece Riggers 09/16/2011, 9:16 AM   Addendum by [facility] Medicine Intern S: rare mild contractions.  Good fetal movement.  No headache, loss of fluid, vaginal bleeding.  Elevated BP last 3 weeks, no signs/symptoms of pre-E.  Labs done on 9/17 and within normal limits. O: vitals reviewed; BP mildly elevated Abd: size consistent with dates, 8.5lb by Leopold's, vertex Dilation: Fingertip Effacement (%):  50 Cervical Position: Posterior Station: -3 Presentation: Vertex Exam by:: booth A/P: 25 year old G3P2002 at 21.2 presenting for IOL for post dates Rh neg, rubella immune, GBS negative Elevated BPs without pre-eclampsia  Admit to L&D cytotec for cervical  ripening Monitor BPs closely  BOOTH, ERIN 09/16/2011, 9:58 AM

## 2011-09-17 ENCOUNTER — Encounter (HOSPITAL_COMMUNITY): Payer: Self-pay | Admitting: Anesthesiology

## 2011-09-17 ENCOUNTER — Inpatient Hospital Stay (HOSPITAL_COMMUNITY): Payer: Medicaid Other | Admitting: Anesthesiology

## 2011-09-17 ENCOUNTER — Encounter (HOSPITAL_COMMUNITY): Payer: Self-pay

## 2011-09-17 DIAGNOSIS — Z3480 Encounter for supervision of other normal pregnancy, unspecified trimester: Secondary | ICD-10-CM

## 2011-09-17 LAB — CBC
HCT: 34.8 % — ABNORMAL LOW (ref 36.0–46.0)
HCT: 30.1 % — ABNORMAL LOW (ref 36.0–46.0)
Hemoglobin: 11.6 g/dL — ABNORMAL LOW (ref 12.0–15.0)
Hemoglobin: 10.2 g/dL — ABNORMAL LOW (ref 12.0–15.0)
MCH: 28.8 pg (ref 26.0–34.0)
MCHC: 33.9 g/dL (ref 30.0–36.0)
MCV: 84.5 fL (ref 78.0–100.0)
MCV: 85 fL (ref 78.0–100.0)
WBC: 6.1 10*3/uL (ref 4.0–10.5)

## 2011-09-17 MED ORDER — DIBUCAINE 1 % RE OINT: 1.0000 "application " | TOPICAL_OINTMENT | RECTAL | Status: DC | PRN

## 2011-09-17 MED ORDER — OXYTOCIN 10 UNIT/ML IJ SOLN
INTRAMUSCULAR | Status: AC
  Administered 2011-09-17: 10 [IU] via INTRAVENOUS
  Filled 2011-09-17: qty 1

## 2011-09-17 MED ORDER — LIDOCAINE HCL 1.5 % IJ SOLN
INTRAMUSCULAR | Status: DC | PRN
Start: 2011-09-17 — End: 2011-09-17
  Administered 2011-09-17: 5 mL via EPIDURAL
  Administered 2011-09-17: 2 mL via EPIDURAL
  Administered 2011-09-17: 5 mL via EPIDURAL

## 2011-09-17 MED ORDER — DIPHENHYDRAMINE HCL 25 MG PO CAPS: 25.0000 mg | ORAL_CAPSULE | Freq: Four times a day (QID) | ORAL | Status: DC | PRN

## 2011-09-17 MED ORDER — OXYTOCIN 20 UNITS IN LACTATED RINGERS INFUSION - SIMPLE
1.0000 m[IU]/min | INTRAVENOUS | Status: DC
  Administered 2011-09-17: 1 m[IU]/min via INTRAVENOUS
  Filled 2011-09-17: qty 1000

## 2011-09-17 MED ORDER — LANOLIN HYDROUS EX OINT: TOPICAL_OINTMENT | CUTANEOUS | Status: DC | PRN

## 2011-09-17 MED ORDER — EPHEDRINE 5 MG/ML INJ
10.0000 mg | INTRAVENOUS | Status: DC | PRN
  Filled 2011-09-17: qty 4

## 2011-09-17 MED ORDER — LABETALOL HCL 5 MG/ML IV SOLN
10.0000 mg | INTRAVENOUS | Status: DC | PRN
  Administered 2011-09-17 (×2): 10 mg via INTRAVENOUS
  Filled 2011-09-17: qty 4

## 2011-09-17 MED ORDER — TETANUS-DIPHTH-ACELL PERTUSSIS 5-2.5-18.5 LF-MCG/0.5 IM SUSP
0.5000 mL | Freq: Once | INTRAMUSCULAR | Status: AC
  Administered 2011-09-18: 0.5 mL via INTRAMUSCULAR
  Filled 2011-09-17: qty 0.5

## 2011-09-17 MED ORDER — ZOLPIDEM TARTRATE 5 MG PO TABS: 5.0000 mg | ORAL_TABLET | Freq: Every evening | ORAL | Status: DC | PRN

## 2011-09-17 MED ORDER — FENTANYL 2.5 MCG/ML BUPIVACAINE 1/10 % EPIDURAL INFUSION (WH - ANES)
14.0000 mL/h | INTRAMUSCULAR | Status: DC
  Administered 2011-09-17 (×3): 14 mL/h via EPIDURAL
  Filled 2011-09-17 (×3): qty 60

## 2011-09-17 MED ORDER — SENNOSIDES-DOCUSATE SODIUM 8.6-50 MG PO TABS
2.0000 | ORAL_TABLET | Freq: Every day | ORAL | Status: DC
  Administered 2011-09-17: 2 via ORAL

## 2011-09-17 MED ORDER — TERBUTALINE SULFATE 1 MG/ML IJ SOLN: 0.2500 mg | Freq: Once | INTRAMUSCULAR | Status: DC | PRN

## 2011-09-17 MED ORDER — WITCH HAZEL-GLYCERIN EX PADS: 1.0000 "application " | MEDICATED_PAD | CUTANEOUS | Status: DC | PRN

## 2011-09-17 MED ORDER — SIMETHICONE 80 MG PO CHEW: 80.0000 mg | CHEWABLE_TABLET | ORAL | Status: DC | PRN

## 2011-09-17 MED ORDER — OXYCODONE-ACETAMINOPHEN 5-325 MG PO TABS: 1.0000 | ORAL_TABLET | ORAL | Status: DC | PRN

## 2011-09-17 MED ORDER — EPHEDRINE 5 MG/ML INJ
10.0000 mg | INTRAVENOUS | Status: DC | PRN
  Filled 2011-09-17 (×2): qty 4

## 2011-09-17 MED ORDER — DIPHENHYDRAMINE HCL 50 MG/ML IJ SOLN: 12.5000 mg | INTRAMUSCULAR | Status: DC | PRN

## 2011-09-17 MED ORDER — BENZOCAINE-MENTHOL 20-0.5 % EX AERO: 1.0000 "application " | INHALATION_SPRAY | CUTANEOUS | Status: DC | PRN

## 2011-09-17 MED ORDER — MISOPROSTOL 200 MCG PO TABS
ORAL_TABLET | ORAL | Status: AC
  Administered 2011-09-17: 1000 ug via RECTAL
  Filled 2011-09-17: qty 5

## 2011-09-17 MED ORDER — PHENYLEPHRINE 40 MCG/ML (10ML) SYRINGE FOR IV PUSH (FOR BLOOD PRESSURE SUPPORT)
80.0000 ug | PREFILLED_SYRINGE | INTRAVENOUS | Status: DC | PRN
  Filled 2011-09-17: qty 5

## 2011-09-17 MED ORDER — ONDANSETRON HCL 4 MG PO TABS: 4.0000 mg | ORAL_TABLET | ORAL | Status: DC | PRN

## 2011-09-17 MED ORDER — LACTATED RINGERS IV SOLN
500.0000 mL | Freq: Once | INTRAVENOUS | Status: AC
  Administered 2011-09-17: 500 mL via INTRAVENOUS

## 2011-09-17 MED ORDER — LACTATED RINGERS IV SOLN
INTRAVENOUS | Status: DC
  Administered 2011-09-17 – 2011-09-18 (×2): via INTRAVENOUS

## 2011-09-17 MED ORDER — ONDANSETRON HCL 4 MG/2ML IJ SOLN: 4.0000 mg | INTRAMUSCULAR | Status: DC | PRN

## 2011-09-17 MED ORDER — PRENATAL PLUS 27-1 MG PO TABS
1.0000 | ORAL_TABLET | Freq: Every day | ORAL | Status: DC
  Administered 2011-09-18 – 2011-09-19 (×2): 1 via ORAL
  Filled 2011-09-17 (×2): qty 1

## 2011-09-17 MED ORDER — PHENYLEPHRINE 40 MCG/ML (10ML) SYRINGE FOR IV PUSH (FOR BLOOD PRESSURE SUPPORT)
80.0000 ug | PREFILLED_SYRINGE | INTRAVENOUS | Status: DC | PRN
  Filled 2011-09-17 (×2): qty 5

## 2011-09-17 MED ORDER — IBUPROFEN 600 MG PO TABS
600.0000 mg | ORAL_TABLET | Freq: Four times a day (QID) | ORAL | Status: DC
  Administered 2011-09-17 – 2011-09-19 (×7): 600 mg via ORAL
  Filled 2011-09-17 (×7): qty 1

## 2011-09-17 NOTE — Progress Notes (Signed)
Erica [name] is a 25 y.o. [id number] at [redacted]w[redacted]d by LMP admitted for induction of labor due to Post dates. Due date 09/07/11.  Subjective: Patient uncomfortable, complaining of lots of pressure.   Objective: BP 156/98  Pulse 91  Temp(Src) 98 F (36.7 C) (Oral)  Resp 18  Ht 5\' 4"  (1.626 m)  Wt 254 lb (115.214 kg)  BMI 43.60 kg/m2  SpO2 97%  LMP 11/23/2010 I/O last 3 completed shifts: In: 2456.6 [P.O.:740; I.V.:1716.6] Out: 1600 [Urine:1600] Total I/O In: 453.7 [I.V.:453.7] Out: 335 [Urine:335]  FHT:  FHR: 145 bpm, variability: moderate,  accelerations:  Abscent,  decelerations:  Present Early decells, drops 5-10 bpm below baseline for less than 30 second.  UC:   regular, every 2-4 minutes SVE:   8-9, anterior lip thick  Labs: Lab Results  Component Value Date   WBC 6.1 09/17/2011   HGB 11.6* 09/17/2011   HCT 34.8* 09/17/2011   MCV 84.5 09/17/2011   PLT 134* 09/17/2011    Assessment / Plan: Induction of labor due to postterm,  progressing well on pitocin  Labor: Progressing normally and continue pitocin, IUPC placed to moinitor contractions.  Preeclampsia:  on magnesium sulfate and no signs or symptoms of toxicity Fetal Wellbeing:  Category II Pain Control:  Epidural I/D:  n/a Anticipated MOD:  NSVD  [name] 09/17/2011, 11:30 AM

## 2011-09-17 NOTE — Anesthesia Procedure Notes (Signed)
Epidural Patient location during procedure: OB Start time: 09/17/2011 5:58 AM Reason for block: procedure for pain  Staffing Performed by: anesthesiologist   Preanesthetic Checklist Completed: patient identified, site marked, surgical consent, pre-op evaluation, timeout performed, IV checked, risks and benefits discussed and monitors and equipment checked  Epidural Patient position: sitting Prep: site prepped and draped and DuraPrep Patient monitoring: continuous pulse ox and blood pressure Approach: midline Injection technique: LOR air  Needle:  Needle type: Tuohy  Needle gauge: 17 G Needle length: 9 cm Needle insertion depth: 7 cm Catheter type: closed end flexible Catheter size: 19 Gauge Catheter at skin depth: 12 cm Test dose: negative  Assessment Events: blood not aspirated, injection not painful, no injection resistance, negative IV test and no paresthesia

## 2011-09-17 NOTE — Progress Notes (Signed)
Erica [name] is a 25 y.o. [id number] at [redacted]w[redacted]d by LMP admitted for induction of labor due to Post dates. Due date 09/07/11.  Subjective:   Objective: BP 154/99  Pulse 83  Temp(Src) 98.1 F (36.7 C) (Oral)  Resp 18  Ht 5\' 4"  (1.626 m)  Wt 254 lb (115.214 kg)  BMI 43.60 kg/m2  SpO2 97%  LMP 11/23/2010 I/O last 3 completed shifts: In: 2456.6 [P.O.:740; I.V.:1716.6] Out: 1600 [Urine:1600]    FHT:  FHR: 140 bpm, variability: minimal ,  accelerations:  Abscent,  decelerations:  Absent UC:   regular, every 4 minutes SVE:   Dilation: 7 Effacement (%): 70;60 Station: -3 Exam by:: l.poore, rn  Labs: Lab Results  Component Value Date   WBC 6.1 09/17/2011   HGB 11.6* 09/17/2011   HCT 34.8* 09/17/2011   MCV 84.5 09/17/2011   PLT 134* 09/17/2011    Assessment / Plan: Induction of labor due to postterm,  progressing well on pitocin  Labor: Progressing normally and continue pitocin. Preeclampsia:  on magnesium sulfate and no signs or symptoms of toxicity Fetal Wellbeing:  Category II Pain Control:  Epidural I/D:  n/a Anticipated MOD:  NSVD  Jermain Curt 09/17/2011, 7:24 AM

## 2011-09-17 NOTE — Progress Notes (Signed)
Zilla Ewing Rath is a 25 y.o. [id number] at [redacted]w[redacted]d by LMP admitted for induction of labor due to Post dates. Due date 09/07/11.  Subjective: Pt comfortable, feeling mild pressure  Objective: BP 151/86  Pulse 89  Temp(Src) 97.2 F (36.2 C) (Oral)  Resp 15  Ht 5\' 4"  (1.626 m)  Wt 254 lb (115.214 kg)  BMI 43.60 kg/m2  SpO2 97%  LMP 11/23/2010 I/O last 3 completed shifts: In: 2456.6 [P.O.:740; I.V.:1716.6] Out: 1600 [Urine:1600] Total I/O In: 224 [I.V.:224] Out: 210 [Urine:210]  FHT:  FHR: 1401 bpm, variability: moderate,  accelerations:  Abscent,  decelerations:  Absent UC:   regular, every 4-5 minutes SVE:   Dilation: 7 Effacement (%): 70;60 Station: -3 Exam by:: l.poore, rn  Labs: Lab Results  Component Value Date   WBC 6.1 09/17/2011   HGB 11.6* 09/17/2011   HCT 34.8* 09/17/2011   MCV 84.5 09/17/2011   PLT 134* 09/17/2011    Assessment / Plan: Spontaneous labor, progressing normally  Labor: Progressing normally Preeclampsia:  on magnesium sulfate Fetal Wellbeing:  Category II Pain Control:  Epidural I/D:  n/a Anticipated MOD:  NSVD  Hatcher Froning 09/17/2011, 9:11 AM

## 2011-09-17 NOTE — Progress Notes (Signed)
Erica [name] is a 25 y.o. Erica [name] at [redacted]w[redacted]d by LMP admitted for induction of labor due to Post dates. Due date 09/07/11.  Subjective:   Objective: BP 155/94  Pulse 87  Temp(Src) 97.2 F (36.2 C) (Oral)  Resp 21  Ht 5\' 4"  (1.626 m)  Wt 254 lb (115.214 kg)  BMI 43.60 kg/m2  SpO2 97%  LMP 11/23/2010 I/O last 3 completed shifts: In: 2456.6 [P.O.:740; I.V.:1716.6] Out: 1600 [Urine:1600] Total I/O In: 336 [I.V.:336] Out: 260 [Urine:260]  FHT:  FHR: 145 bpm, variability: moderate,  accelerations:  Abscent,  decelerations:  Absent UC:   regular, every 4 minutes SVE:   Dilation: 8 Effacement (%): 100 Station: -2 Exam by:: Dr Erica [name]  Labs: Lab Results  Component Value Date   WBC 6.1 09/17/2011   HGB 11.6* 09/17/2011   HCT 34.8* 09/17/2011   MCV 84.5 09/17/2011   PLT 134* 09/17/2011    Assessment / Plan: Induction of labor due to postterm,  progressing well on pitocin  Labor: Progressing on Pitocin, AROM now. Preeclampsia:  on magnesium sulfate Fetal Wellbeing:  Category II Pain Control:  Epidural I/D:  n/a Anticipated MOD:  NSVD  Erica [name] 09/17/2011, 10:31 AM

## 2011-09-17 NOTE — Anesthesia Postprocedure Evaluation (Signed)
Anesthesia Post Note  Patient: Erica [name]  Procedure(s) Performed: * No procedures listed *  Anesthesia type: Epidural  Patient location: Mother/Baby  Post pain: Pain level controlled  Post assessment: Post-op Vital signs reviewed  Last Vitals:  Filed Vitals:   09/17/11 1605  BP:   Pulse: 84  Temp:   Resp:     Post vital signs: Reviewed  Level of consciousness: awake  Complications: No apparent anesthesia complications

## 2011-09-17 NOTE — Anesthesia Preprocedure Evaluation (Signed)
Anesthesia Evaluation  Name, MR# and DOB Patient awake  General Assessment Comment  Reviewed: Allergy & Precautions, H&P , NPO status , Patient's Chart, lab work & pertinent test results, reviewed documented beta blocker date and time   History of Anesthesia Complications Negative for: history of anesthetic complications  Airway Mallampati: II TM Distance: >3 FB Neck ROM: full    Dental  (+) Teeth Intact   Pulmonary  asthma (last inh use yesterday)  clear to auscultation  breath sounds clear to auscultation none    Cardiovascular hypertension (preeclampsia - on MgSO4), regular Normal    Neuro/Psych Negative Neurological ROS  Negative Psych ROS  GI/Hepatic/Renal negative GI ROS  negative Liver ROS  negative Renal ROS        Endo/Other  (+)   Morbid obesity  Abdominal   Musculoskeletal   Hematology negative hematology ROS (+)   Peds  Reproductive/Obstetrics (+) Pregnancy    Anesthesia Other Findings             Anesthesia Physical Anesthesia Plan  ASA: III  Anesthesia Plan: Epidural   Post-op Pain Management:    Induction:   Airway Management Planned:   Additional Equipment:   Intra-op Plan:   Post-operative Plan:   Informed Consent: I have reviewed the patients History and Physical, chart, labs and discussed the procedure including the risks, benefits and alternatives for the proposed anesthesia with the patient or authorized representative who has indicated his/her understanding and acceptance.     Plan Discussed with:   Anesthesia Plan Comments:         Anesthesia Quick Evaluation

## 2011-09-17 NOTE — Progress Notes (Signed)
Aneesa Ewing Kozakiewicz is a 25 y.o. G3P2002 at [redacted]w[redacted]d Subjective: Requesting epidural  Objective: BP 157/83  Pulse 77  Temp(Src) 97.8 F (36.6 C) (Oral)  Resp 20  Ht 5\' 4"  (1.626 m)  Wt 115.214 kg (254 lb)  BMI 43.60 kg/m2  LMP 11/23/2010   Total I/O In: 1951.7 [P.O.:990; I.V.:961.7] Out: 1150 [Urine:1150]  FHT:  FHR: 130 bpm, variability: moderate,  accelerations:  Present,  decelerations:  Absent UC:   irregular, every 3-5 minutes SVE:   Dilation: 4 Effacement (%): 50 Station: -3 Exam by:: l.poore, rn  Labs: Lab Results  Component Value Date   WBC 5.0 09/16/2011   HGB 11.0* 09/16/2011   HCT 32.0* 09/16/2011   MCV 84.4 09/16/2011   PLT 125* 09/16/2011    Assessment / Plan: Early labor GHTN- on mag for servere range pressures  Will have epid placed Continue to inc Pitocin to achieve reg ctx   Kaylon Hitz 09/17/2011, 5:05 AM

## 2011-09-18 LAB — CBC
HCT: 25.2 % — ABNORMAL LOW (ref 36.0–46.0)
Hemoglobin: 8.6 g/dL — ABNORMAL LOW (ref 12.0–15.0)
MCH: 29.2 pg (ref 26.0–34.0)
MCHC: 34.1 g/dL (ref 30.0–36.0)
MCV: 85.4 fL (ref 78.0–100.0)

## 2011-09-18 MED ORDER — RHO D IMMUNE GLOBULIN 1500 UNIT/2ML IJ SOLN
300.0000 ug | Freq: Once | INTRAMUSCULAR | Status: AC
  Administered 2011-09-18: 300 ug via INTRAVENOUS
  Filled 2011-09-18: qty 2

## 2011-09-18 NOTE — Progress Notes (Signed)
UR chart review completed.  

## 2011-09-18 NOTE — Anesthesia Postprocedure Evaluation (Signed)
  Anesthesia Post-op Note  Patient: Erica [name]  Procedure(s) Performed:CLE   Patient Location: 373  Anesthesia Type: Epidural  Level of Consciousness: awake, alert  and oriented  Airway and Oxygen Therapy: Patient Spontanous Breathing  Post-op Pain: mild  Post-op Assessment: Post-op Vital signs reviewed, Patient's Cardiovascular Status Stable, Pain level controlled, No headache, No backache, No residual numbness and No residual motor weakness  Post-op Vital Signs: Reviewed and stable  Complications: No apparent anesthesia complications

## 2011-09-18 NOTE — Progress Notes (Signed)
Post Partum Day 1 Subjective: up ad lib, voiding, tolerating PO and baby in nursery overnight, mom slept well feeling much better this am.   Objective: Blood pressure 122/68, pulse 84, temperature 98.5 F (36.9 C), temperature source Oral, resp. rate 16, height 5\' 4"  (1.626 m), weight 247 lb 9.6 oz (112.311 kg), last menstrual period 11/23/2010, SpO2 99.00%, unknown if currently breastfeeding.  Physical Exam:  General: alert, cooperative and no distress Lochia: appropriate Uterine Fundus: firm DVT Evaluation: No evidence of DVT seen on physical exam. Negative Homan's sign. Trace ankle and foot edema, decreased from yesterday.    Basename 09/18/11 0520 09/17/11 1512  HGB 8.6* 10.2*  HCT 25.2* 30.1*    Assessment/Plan: Continue Mag until postpartum x 24 hours.  Monitor Lochia/bleeding closely, re-check cbc if any signs of heavy bleeding.  Plan for discharge tomorrow and Contraception Implanon at 6 week post partum visit.   LOS: 2 days   Erica [name] 09/18/2011, 7:34 AM

## 2011-09-18 NOTE — Progress Notes (Signed)
Encounter addended by: Karleen Dolphin on: 09/18/2011  9:24 AM<BR>     Documentation filed: Notes Section, Charges VN

## 2011-09-19 LAB — CBC
HCT: 22.4 % — ABNORMAL LOW (ref 36.0–46.0)
Hemoglobin: 7.5 g/dL — ABNORMAL LOW (ref 12.0–15.0)
MCH: 29 pg (ref 26.0–34.0)
MCV: 86.5 fL (ref 78.0–100.0)
RBC: 2.59 MIL/uL — ABNORMAL LOW (ref 3.87–5.11)
WBC: 6.7 10*3/uL (ref 4.0–10.5)

## 2011-09-19 LAB — RH IG WORKUP (INCLUDES ABO/RH)
ABO/RH(D): A NEG
Unit division: 0

## 2011-09-19 MED ORDER — DOCUSATE SODIUM 100 MG PO CAPS: 100.0000 mg | ORAL_CAPSULE | Freq: Two times a day (BID) | ORAL | Status: AC

## 2011-09-19 MED ORDER — FERROUS SULFATE 325 (65 FE) MG PO TABS: 325.0000 mg | ORAL_TABLET | Freq: Two times a day (BID) | ORAL | Status: DC

## 2011-09-19 NOTE — Progress Notes (Signed)
Pt discharged to home with husband, 25yo daughter, and newborn son.  Condition stable.  Pt ambulated to car with NT.  No equipment for home prescribed.

## 2011-09-19 NOTE — Discharge Summary (Signed)
Obstetric Discharge Summary Reason for Admission: induction of labor Prenatal Procedures: NST, Preeclampsia and ultrasound Intrapartum Procedures: spontaneous vaginal delivery Postpartum Procedures: none Complications-Operative and Postpartum: hemorrhage Hemoglobin  Date Value Range Status  09/18/2011 8.6* 12.0-15.0 (g/dL) Final     HCT  Date Value Range Status  09/18/2011 25.2* 36.0-46.0 (%) Final    Discharge Diagnoses: Post-date pregnancy and Preelampsia  Discharge Information: Date: 09/19/2011 Activity: pelvic rest Diet: routine Medications: PNV, Ibuprofen and Iron Condition: stable Instructions: refer to practice specific booklet Discharge to: home   Newborn Data: Live born female  Birth Weight: 10 lb 1.4 oz (4576 g) APGAR: 7, 8  Home with mother.  Erica [name] 09/19/2011, 8:37 AM

## 2011-09-19 NOTE — Progress Notes (Signed)
Post Partum Day # 2 Subjective: no complaints, up ad lib, voiding, tolerating PO and + flatus  Objective: Blood pressure 131/86, pulse 85, temperature 97.6 F (36.4 C), temperature source Oral, resp. rate 18, height 5\' 4"  (1.626 m), weight 247 lb 9.6 oz (112.311 kg), last menstrual period 11/23/2010, SpO2 98.00%, unknown if currently breastfeeding.  Physical Exam:  General: alert, cooperative and no distress Lochia: appropriate Uterine Fundus: firm DVT Evaluation: No evidence of DVT seen on physical exam.   Basename 09/18/11 0520 09/17/11 1512  HGB 8.6* 10.2*  HCT 25.2* 30.1*    Assessment/Plan: Discharge home and Contraception Implanon at post partum visit.    LOS: 3 days   Erica [name] 09/19/2011, 7:05 AM

## 2011-09-20 NOTE — Discharge Summary (Signed)
Patient seen - agree with above.

## 2011-09-21 NOTE — ED Provider Notes (Signed)
Agree with note above. MUHAMMAD,WALIDAH 

## 2011-09-23 NOTE — Progress Notes (Signed)
NST 9/21 reviewed, reactive no decelerations

## 2011-09-23 NOTE — Progress Notes (Signed)
NST 9/25 reviewed reactive no decelerations

## 2011-10-01 ENCOUNTER — Telehealth: Payer: Self-pay | Admitting: Family Medicine

## 2011-10-01 NOTE — Telephone Encounter (Signed)
Short term disability form placed in Dr. Melina Modena box for completion.  Ileana Ladd

## 2011-10-01 NOTE — Telephone Encounter (Signed)
Patient dropped off disability forms to be filled out.  Please call her when completed.   °

## 2011-10-06 NOTE — Progress Notes (Signed)
Encounter addended by: Zerita Boers, CM on: 10/06/2011  9:04 AM<BR>     Documentation filed: Charges VN

## 2011-10-07 NOTE — Telephone Encounter (Signed)
Patient notified that form is ready for pick up.

## 2011-10-16 ENCOUNTER — Telehealth: Payer: Self-pay | Admitting: Family Medicine

## 2011-10-16 ENCOUNTER — Encounter: Payer: Self-pay | Admitting: Family Medicine

## 2011-10-16 IMAGING — US US OB DETAIL+14 WK
1 series · 12 of 28 positions shown · non-contrast
Comparison: none

[Series 1: us ob detail +14 wk · 12 of 86 slices shown]
[im 4/86]
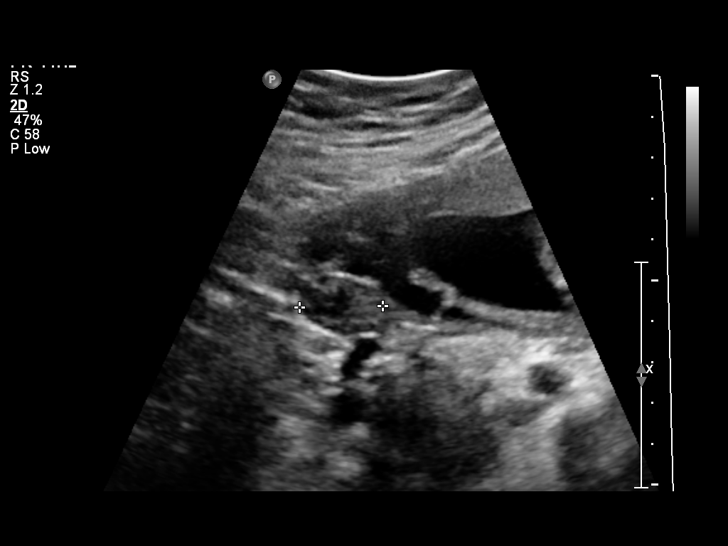
[im 10/86]
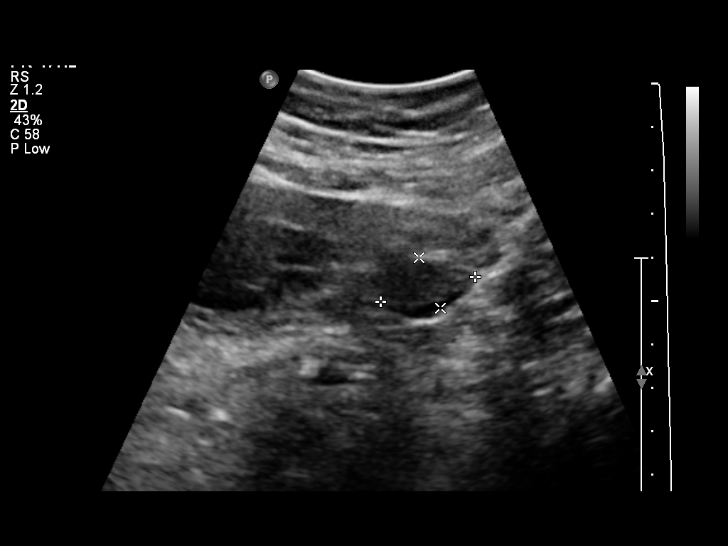
[im 16/86]
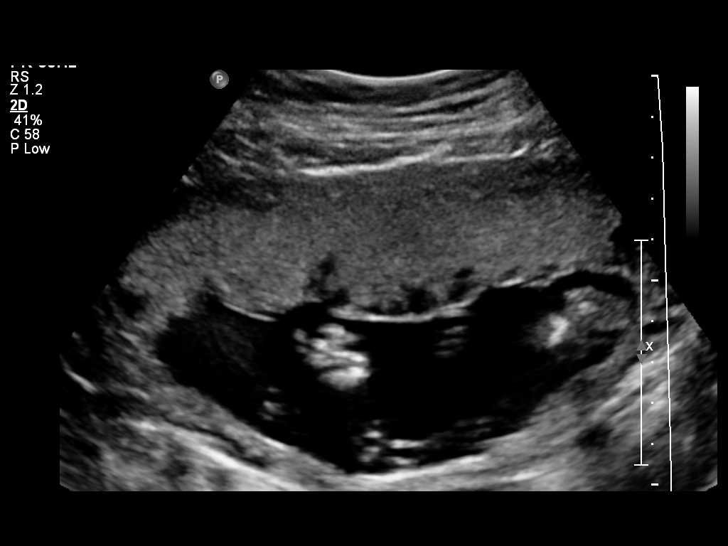
[im 26/86]
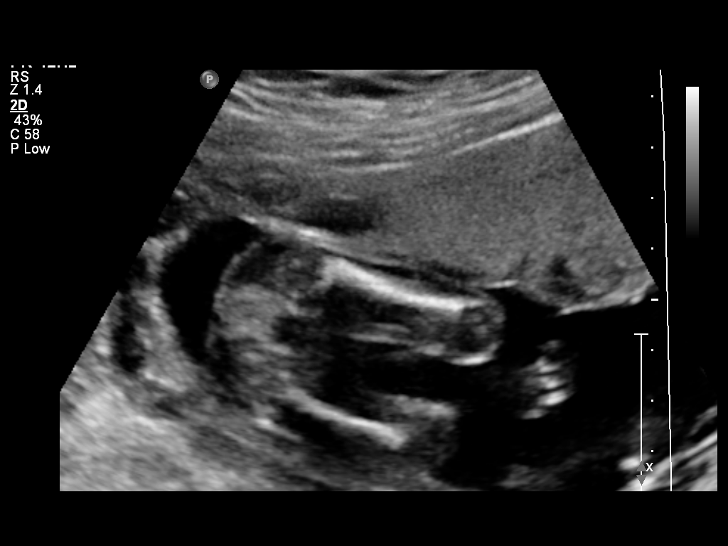
[im 32/86]
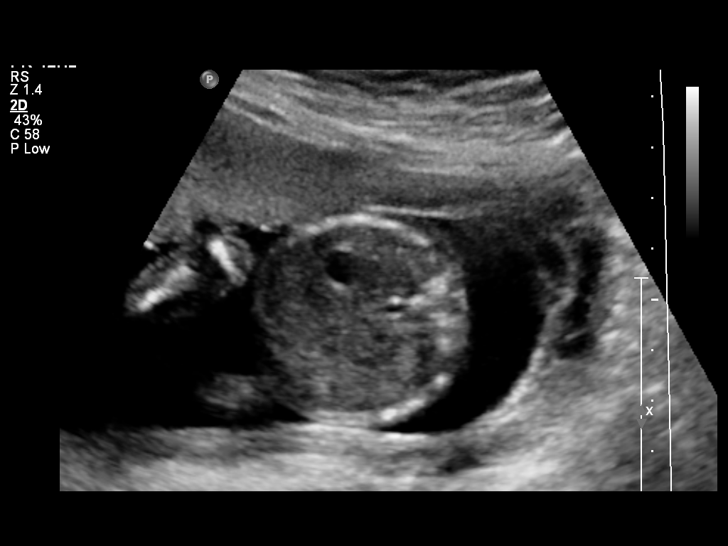
[im 38/86]
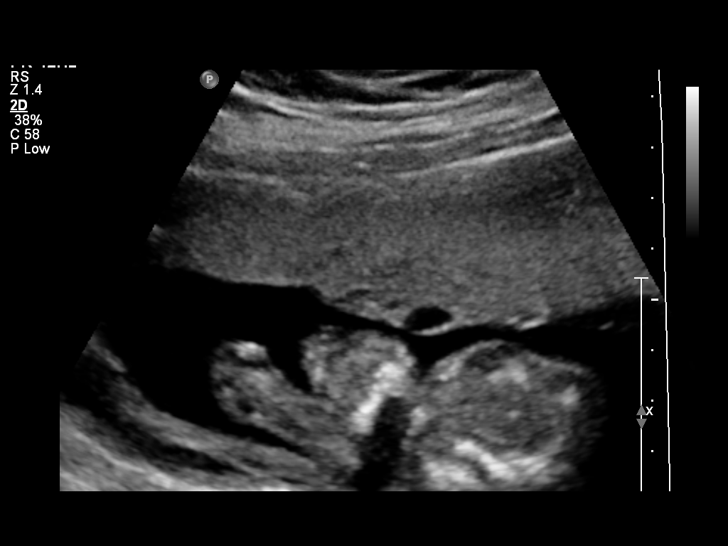
[im 48/86]
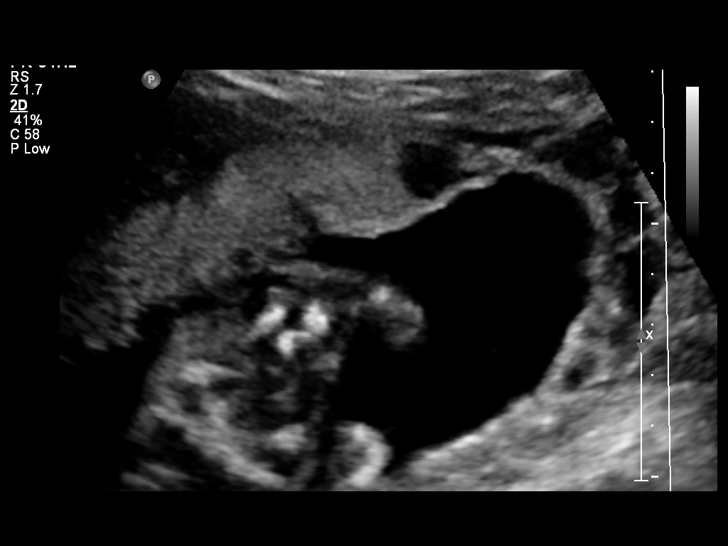
[im 54/86]
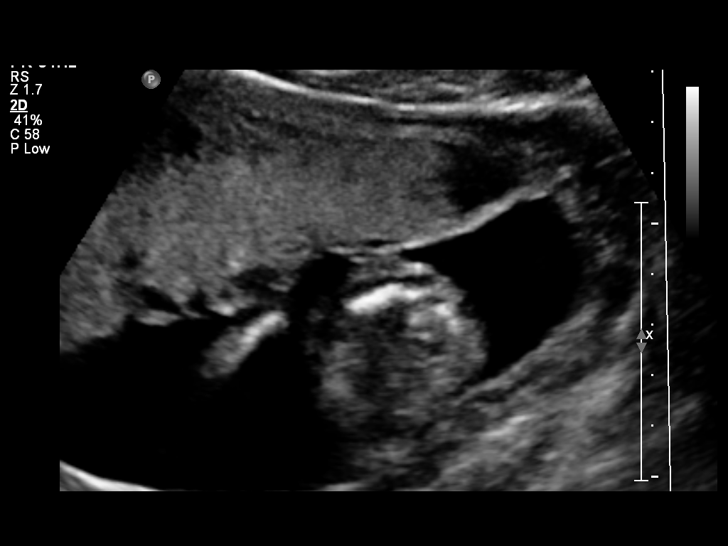
[im 60/86]
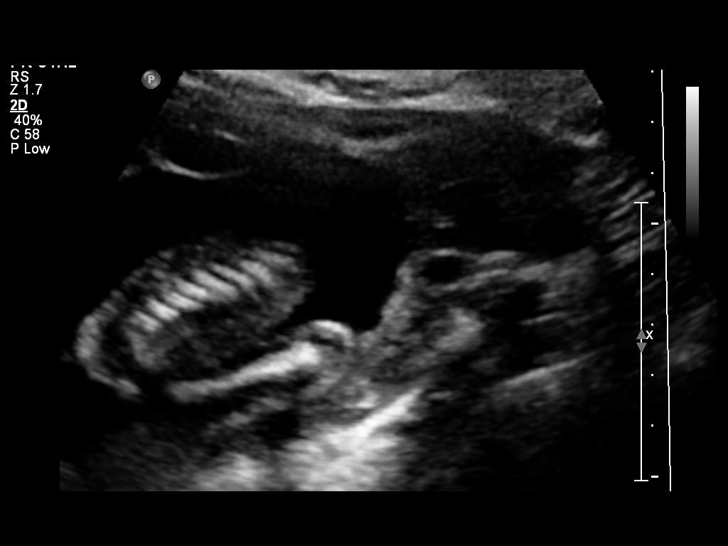
[im 70/86]
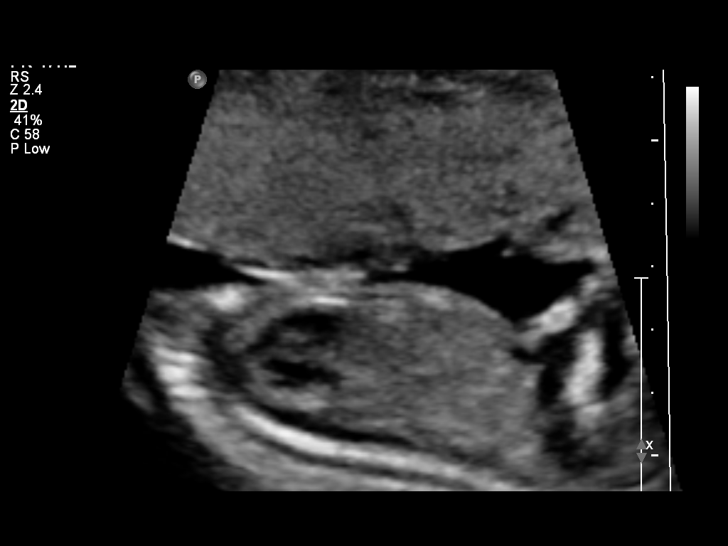
[im 76/86]
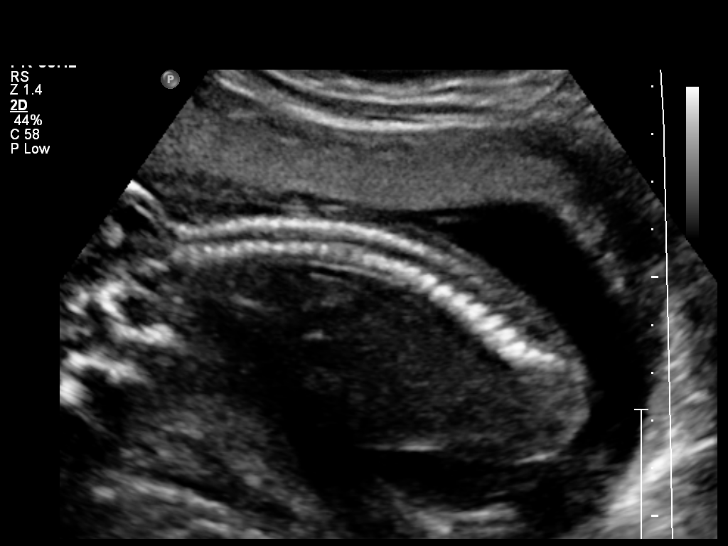
[im 82/86]
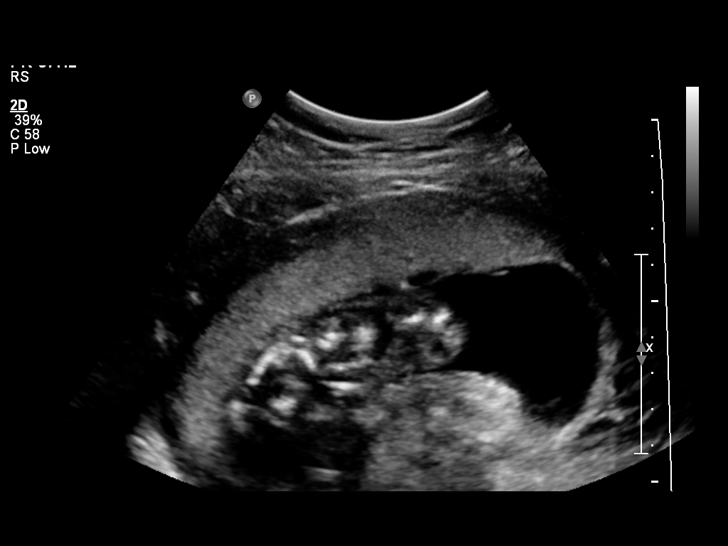

[12 of 28 positions shown; findings below may reference images not displayed]

OBSTETRICS REPORT
                      (Signed Final 04/16/2011 [DATE])

           KAKI

 Order#:         [id number]
Procedures

 US OB DETAIL + 14 WK                                  76811.0
Indications

 Detailed fetal anatomic survey
Fetal Evaluation

 Fetal Heart Rate:  158                          bpm
 Cardiac Activity:  Observed
 Presentation:      Breech
 Placenta:          Anterior, above cervical os

 Amniotic Fluid
 AFI FV:      Subjectively within normal limits
                                             Larg Pckt:     4.9  cm
Biometry

 BPD:     42.1  mm     G. Age:  18w 5d                CI:        64.17   70 - 86
                                                      FL/HC:      17.7   16.1 -

 HC:     169.1  mm     G. Age:  19w 4d       48  %    HC/AC:      1.26   1.09 -

 AC:     134.7  mm     G. Age:  19w 0d       30  %    FL/BPD:
 FL:      29.9  mm     G. Age:  19w 2d       36  %    FL/AC:      22.2   20 - 24
 HUM:     30.1  mm     G. Age:  20w 0d       64  %
 CER:     20.2  mm     G. Age:  19w 1d       46  %
 NFT:     3.85  mm

 Est. FW:     274  gm    0 lb 10 oz      40  %
Gestational Age

 LMP:           20w 4d        Date:  11/23/10                 EDD:   08/30/11
 U/S Today:     19w 1d                                        EDD:   09/09/11
 Best:          19w 3d     Det. By:  U/S (03/14/11)           EDD:   09/07/11
Anatomy

 Cranium:           Appears normal      Aortic Arch:       Appears normal
 Fetal Cavum:       Appears normal      Ductal Arch:       Appears normal
 Ventricles:        Appears normal      Diaphragm:         Appears normal
 Choroid Plexus:    Appears normal      Stomach:           Appears
                                                           normal, left
                                                           sided
 Cerebellum:        Appears normal      Abdomen:           Appears normal
 Posterior Fossa:   Appears normal      Abdominal Wall:    Appears nml
                                                           (cord insert,
                                                           abd wall)
 Nuchal Fold:       Appears normal      Cord Vessels:      Appears normal
                    (neck, nuchal                          (3 vessel cord)
                    fold)
 Face:              Lips appear         Kidneys:           Appear normal
                    normal
 Heart:             Not well            Bladder:           Appears normal
                    visualized
 RVOT:              Appears normal      Spine:             Appears normal
 LVOT:              Appears normal      Limbs:             Four extremities
                                                           seen
Targeted Anatomy

 Fetal Central Nervous System
 Cisterna Magna:
Cervix Uterus Adnexa

 Cervical Length:    3.4      cm

 Cervix:       Normal appearance by transabdominal scan.
 Left Ovary:    Within normal limits.
 Right Ovary:   Within normal limits.
 Adnexa:     No abnormality visualized.
Impression

 Single live IUP in breech presentation.   Concordant
 measurements/assigned GA by US.
 4-chamber heart not well visualized due to fetal postiion,
 other anatomy normal as listed above.

## 2011-10-16 NOTE — Telephone Encounter (Signed)
Erica [name] need note stating she can return to work on 10/29 without any restrictions.  Need this asap

## 2011-10-17 ENCOUNTER — Ambulatory Visit (INDEPENDENT_AMBULATORY_CARE_PROVIDER_SITE_OTHER): Payer: Medicaid Other | Admitting: Family Medicine

## 2011-10-17 ENCOUNTER — Encounter: Payer: Self-pay | Admitting: Family Medicine

## 2011-10-17 VITALS — BP 142/94 | HR 76 | Temp 98.2°F | Ht 64.0 in | Wt 210.0 lb

## 2011-10-17 DIAGNOSIS — R030 Elevated blood-pressure reading, without diagnosis of hypertension: Secondary | ICD-10-CM

## 2011-10-17 DIAGNOSIS — N898 Other specified noninflammatory disorders of vagina: Secondary | ICD-10-CM | POA: Insufficient documentation

## 2011-10-17 LAB — POCT WET PREP (WET MOUNT)
Trichomonas Wet Prep HPF POC: NEGATIVE
WBC, Wet Prep HPF POC: >20
Yeast Wet Prep HPF POC: NEGATIVE

## 2011-10-17 NOTE — Assessment & Plan Note (Signed)
Improved on recheck to 137/68.  No s/s of pre-X and pt w/ documented elevated BPs throughout pregnancy. BP recheck in 2 weeks.

## 2011-10-17 NOTE — Progress Notes (Signed)
S: Pt comes in today for vaginal discharge.   VAGINAL DISCHARGE Pt 4 weeks post partum. No fevers or chills. Causing itching and has odor.  Has h/o BV and she thinks that's what it is. Eating/drinking ok. D/c is thin, clear-ish.  No vaginal bleeding x1 week.    ELEVATED BP Pt has elevated BPs to 140s-150s systolic during pregnancy, without pre-eclampsia.  Pt is without headache, RUQ pain, LE edema, N/V.     ROS: Per HPI  History  Smoking status  . Never Smoker   Smokeless tobacco  . Never Used    O:  Filed Vitals:   10/17/11 0851  BP: 142/94  Pulse: 76  Temp: 98.2 F (36.8 C)   BP recheck: 137/68  Gen: NAD CV: RRR, no murmur Pulm: CTA bilat, no wheezes or crackles Abd: soft, NT GU: no CMT, pelvic exam showing small amount of blood-tinged discharge, no frank pus, os appears normal Ext: Warm, no chronic skin changes, no edema   A/P: 25 y.o. female p/w vaginal discharge -See problem list -f/u in 2 weeks with PCP

## 2011-10-17 NOTE — Patient Instructions (Addendum)
It was great to meet you! Congratulations on the baby! It looks like your vaginal discharge is because of normal postpartum changes. I do not think that we need to treat it with anything. If you're still having itching and discharge at your followup appointment in 2 weeks, please to Dr. Lula Olszewski know and she can do another wet prep. Your blood pressure was a little bit elevated today but I do not think we need to do anything about it. Make sure you keep her appointment with Dr. Lula Olszewski on 11/9. Please call the office or go to the [facility] if you start having severe headaches, shortness of breath, swelling in her legs, or pain under your right ribs.

## 2011-10-17 NOTE — Assessment & Plan Note (Signed)
No clue cells.  Appears c/w post-partum d/c.  No txt needed.  Could do wet prep again at 6wk PP visit if needed. No fevers/chills, no CMT so unlikely to be endometritis.

## 2011-10-31 ENCOUNTER — Encounter: Payer: Self-pay | Admitting: Family Medicine

## 2011-10-31 ENCOUNTER — Ambulatory Visit (INDEPENDENT_AMBULATORY_CARE_PROVIDER_SITE_OTHER): Payer: Medicaid Other | Admitting: Family Medicine

## 2011-10-31 DIAGNOSIS — Z309 Encounter for contraceptive management, unspecified: Secondary | ICD-10-CM

## 2011-10-31 DIAGNOSIS — N760 Acute vaginitis: Secondary | ICD-10-CM

## 2011-10-31 DIAGNOSIS — Z30017 Encounter for initial prescription of implantable subdermal contraceptive: Secondary | ICD-10-CM

## 2011-10-31 DIAGNOSIS — Z3046 Encounter for surveillance of implantable subdermal contraceptive: Secondary | ICD-10-CM

## 2011-10-31 LAB — POCT WET PREP (WET MOUNT): Trichomonas Wet Prep HPF POC: NEGATIVE

## 2011-10-31 LAB — POCT URINE PREGNANCY: Preg Test, Ur: NEGATIVE

## 2011-10-31 MED ORDER — ETONOGESTREL 68 MG ~~LOC~~ IMPL
68.0000 mg | DRUG_IMPLANT | Freq: Once | SUBCUTANEOUS | Status: AC
  Administered 2011-10-31: 68 mg via SUBCUTANEOUS

## 2011-10-31 NOTE — Progress Notes (Signed)
Subjective:     Erica [name] is a 25 y.o. female who presents for a postpartum visit. She is 6 weeks postpartum following a spontaneous vaginal delivery. I have fully reviewed the prenatal and intrapartum course. The delivery was at 41 gestational weeks. Outcome: spontaneous vaginal delivery. Anesthesia: epidural. Postpartum course has been routine. Baby's course has been normal. Baby is feeding by bottle Rush Barer good start. . Bleeding no bleeding and thin discharge. Pt complianing of some itching. . Bowel function is normal. Bladder function is normal. Patient is not sexually active. Contraception method is none, implanon today. . Postpartum depression screening: negative.  The following portions of the patient's history were reviewed and updated as appropriate: allergies, current medications, past family history, past medical history, past social history, past surgical history and problem list.  Review of Systems Pertinent items are noted in HPI.   Objective:    BP 144/104  Pulse 81  Temp(Src) 98.1 F (36.7 C) (Oral)  Ht 5\' 3"  (1.6 m)  Wt 209 lb 14.4 oz (95.21 kg)  BMI 37.18 kg/m2  General:  alert, cooperative and no distress     Lungs: clear to auscultation bilaterally  Heart:  regular rate and rhythm, S1, S2 normal, no murmur, click, rub or gallop  Abdomen: soft, non-tender; bowel sounds normal; no masses,  no organomegaly   Vulva:  normal  Vagina: vagina negative for erythema and lacerations and vagina positive for thin white discarge, non-odorous.   Cervix:  multiparous appearance and no lesions  Corpus: normal size, contour, position, consistency, mobility, non-tender  Adnexa:  normal adnexa  Rectal Exam: Normal rectovaginal septum, rectal exam not done.           Assessment:    Normal postpartum exam.  Plan:    1. Contraception: Implanon insterted today, see separate procedure note.  2. Wet prep done to eval vaginal discharge and itching.  3. Congratulated pt  on weight loss, continue healthy lifestyle changes.  4. Follow up as needed.

## 2011-10-31 NOTE — Progress Notes (Signed)
PROCEDURE NOTE: Implanon insertion Patient given informed consent.  Appropriate time out taken  Pregnancy test was Negative.  The patient's  left  arm was prepped and draped in the usual sterile fashion.. The ruler used to measure and mark the insertion area 8 cm from medial epicondyle of the elbow. Local anaesthesia obtained using 3 cc of 1% lidocaine with epinephrine. Explanon was inserted per manufacturer's directions. Less than 3 cc blood loss. The insertion site covered with antibiotic ointment and a pressure bandage to minimize bruising. There were no complications and the patient tolerated the procedure well.  Device information was given in handout form. Patient is informed the removal date will be in three years.

## 2011-10-31 NOTE — Patient Instructions (Signed)
Congratulations on your baby.  Your Implanon is good for 3 years for birth control.  Please keep the dressing on until tomorrow.

## 2011-11-03 ENCOUNTER — Encounter: Payer: Self-pay | Admitting: Family Medicine

## 2012-01-13 ENCOUNTER — Other Ambulatory Visit: Payer: Self-pay | Admitting: Family Medicine

## 2012-01-13 ENCOUNTER — Telehealth: Payer: Self-pay | Admitting: Family Medicine

## 2012-01-13 MED ORDER — NORGESTIM-ETH ESTRAD TRIPHASIC 0.18/0.215/0.25 MG-25 MCG PO TABS: 1.0000 | ORAL_TABLET | Freq: Every day | ORAL | Status: DC

## 2012-01-13 NOTE — Telephone Encounter (Signed)
Will forward to MD.  According to pharmacist she needs ortho tricyclen , not othro tricyclen lo.

## 2012-01-13 NOTE — Telephone Encounter (Signed)
Pt gave wrong name for her BCP, it should be Ortho Tricyclen (sprintec) instead of what got called in.  She cannot afford what was sent in. pls change.   [facility]

## 2012-01-14 MED ORDER — NORGESTIMATE-ETH ESTRADIOL 0.25-35 MG-MCG PO TABS: 1.0000 | ORAL_TABLET | Freq: Every day | ORAL | Status: DC

## 2012-01-14 NOTE — Telephone Encounter (Signed)
Rx sent to [facility] 

## 2012-06-09 ENCOUNTER — Telehealth: Payer: Self-pay | Admitting: Family Medicine

## 2012-06-09 NOTE — Telephone Encounter (Signed)
Pt has had Implanon since November and then had to be on the pill to help control her bleeding.  She went off the pill in May and is now bleeding again (has been for 2 weeks).  Wants to know if this is normal or what she should do.

## 2012-06-11 NOTE — Telephone Encounter (Signed)
Tried calling patient back - home phone # disconnected, work phone number no answer/answering machine.  Will try again later.

## 2012-06-15 NOTE — Telephone Encounter (Signed)
Tried calling her again- no answer on work #, and home phone is disconnected.  If patient calls back, please ask for a # she can be reached at or ask her to schedule an appointment.

## 2012-06-18 ENCOUNTER — Encounter: Payer: Self-pay | Admitting: Family Medicine

## 2012-06-18 ENCOUNTER — Ambulatory Visit (INDEPENDENT_AMBULATORY_CARE_PROVIDER_SITE_OTHER): Payer: Self-pay | Admitting: Family Medicine

## 2012-06-18 VITALS — BP 137/94 | HR 75 | Temp 98.3°F | Ht 64.0 in | Wt 205.0 lb

## 2012-06-18 DIAGNOSIS — N926 Irregular menstruation, unspecified: Secondary | ICD-10-CM

## 2012-06-18 DIAGNOSIS — N898 Other specified noninflammatory disorders of vagina: Secondary | ICD-10-CM

## 2012-06-18 DIAGNOSIS — N760 Acute vaginitis: Secondary | ICD-10-CM

## 2012-06-18 LAB — POCT WET PREP (WET MOUNT)

## 2012-06-18 NOTE — Patient Instructions (Signed)
It was good to see you.  I will call you with your lab results.  If you decide you want to switch birth control methods, please call the office and make an appointment.

## 2012-06-19 ENCOUNTER — Telehealth: Payer: Self-pay | Admitting: Family Medicine

## 2012-06-19 DIAGNOSIS — N926 Irregular menstruation, unspecified: Secondary | ICD-10-CM | POA: Insufficient documentation

## 2012-06-19 MED ORDER — METRONIDAZOLE 500 MG PO TABS: 500.0000 mg | ORAL_TABLET | Freq: Three times a day (TID) | ORAL | Status: AC

## 2012-06-19 NOTE — Assessment & Plan Note (Signed)
Long discussion regarding Implanon and bleeding- told her some women will continue to have irregular periods on this form of contraception.  Reassured her that it is not dangerous, just a nuisance to her.  Gave her three options:  1) Continue Implanon, see if bleeding improves 2) Continue Implanon, continue OCP's for the bleeding 3) Remove Implanon, choose other form of birth control.   At this time, she wants to keep waiting to see if her periods will improve.  Let her know she can change her mind and we can do something different if she wants.

## 2012-06-19 NOTE — Progress Notes (Signed)
  Subjective:    Patient ID: [name], female    DOB: [birth year], 26 y.o.   MRN: [id number]  HPI  Ainara comes in for vaginal discharge.  She says it has been there for about a week- it is white or grey and has a bad odor.  She also has some vaginal itching.  She says she had BV once before and thinks that is what this is.  She and her husband are in a monogamous relationship and she has no concern about STD's.  No urinary symptoms.   She also wants to talk about her irregular periods.  She got an Implanon in November.  She had had irregular periods with her Mirena before her prior periods, but took OCP's for a few months and it went away.  She says she took OCP's for three months, and stopped in April.  Since then she has not had a normal period.  She says she bleeds every two weeks or so, sometimes it lasts only a day or two, sometimes up to a week.  She denies any fatigue or light-headedness. She started bleeding again today.   Review of Systems Pertinent itemes in HPI.     Objective:   Physical Exam BP 137/94  Pulse 75  Temp 98.3 F (36.8 C) (Oral)  Ht 5\' 4"  (1.626 m)  Wt 205 lb (92.987 kg)  BMI 35.19 kg/m2  LMP 06/18/2012 General appearance: alert, cooperative and no distress Abdomen: soft, non-tender; bowel sounds normal; no masses,  no organomegaly Pelvic: cervix normal in appearance, external genitalia normal, no adnexal masses or tenderness, no cervical motion tenderness, rectovaginal septum normal, uterus normal size, shape, and consistency and Vagina appears normal- there is blood in os and vaginal vault.  There is a foul odor. Extremities: extremities normal, atraumatic, no cyanosis or edema       Assessment & Plan:

## 2012-06-19 NOTE — Assessment & Plan Note (Signed)
Wet prep sent- suspect BV.

## 2012-06-19 NOTE — Telephone Encounter (Signed)
Called to let patient know- wet prep did show BV, flagyl called in to her pharmacy.

## 2012-07-23 ENCOUNTER — Ambulatory Visit (INDEPENDENT_AMBULATORY_CARE_PROVIDER_SITE_OTHER): Payer: Self-pay | Admitting: Family Medicine

## 2012-07-23 VITALS — BP 121/81 | HR 75 | Temp 98.6°F | Ht 64.0 in | Wt 211.0 lb

## 2012-07-23 DIAGNOSIS — A5901 Trichomonal vulvovaginitis: Secondary | ICD-10-CM

## 2012-07-23 DIAGNOSIS — N898 Other specified noninflammatory disorders of vagina: Secondary | ICD-10-CM

## 2012-07-23 LAB — POCT WET PREP (WET MOUNT)
Clue Cells Wet Prep Whiff POC: POSITIVE
WBC, Wet Prep HPF POC: >20

## 2012-07-23 MED ORDER — METRONIDAZOLE 500 MG PO TABS: ORAL_TABLET | ORAL | Status: DC

## 2012-07-23 NOTE — Progress Notes (Signed)
  Subjective:    Patient ID: Erica [name], female    DOB: 1986/09/16, 26 y.o.   MRN: [id number]  HPI  Erica [name] comes in with vaginal discharge and itching x 1 week.  She did a Monistat 1 treatment OTC, but it did not help.  She denies any dysuria.  She says she and her husband are in a monogamous relationship. She says she is spotting some today.  Review of Systems See HPI    Objective:   Physical Exam BP 121/81  Pulse 75  Temp 98.6 F (37 C) (Oral)  Ht 5\' 4"  (1.626 m)  Wt 211 lb (95.709 kg)  BMI 36.22 kg/m2 General appearance: alert, cooperative and no distress Pelvic: cervix normal in appearance, external genitalia normal, no adnexal masses or tenderness, no cervical motion tenderness, rectovaginal septum normal, uterus normal size, shape, and consistency and There is a grey/brown vaginal discharge and also some blood in the os. There is an odor.        Assessment & Plan:

## 2012-07-23 NOTE — Patient Instructions (Addendum)
I will call you later today with the results of the wet prep.  Please let us know if you are not feeling better soon.

## 2012-07-23 NOTE — Assessment & Plan Note (Signed)
Called patient after wet prep back (she could not wait because she had to be at work).  Let her know she has trichomonas, and that it is an STI.  She said she is OK but did not know about any extra marital encounters her husband had, and denies any herself.  I told her her husband also had to be treated, and have sent in a prescription for Flagyl.

## 2012-08-12 ENCOUNTER — Encounter: Payer: Self-pay | Admitting: *Deleted

## 2012-12-01 ENCOUNTER — Ambulatory Visit (INDEPENDENT_AMBULATORY_CARE_PROVIDER_SITE_OTHER): Payer: Self-pay | Admitting: Family Medicine

## 2012-12-01 ENCOUNTER — Encounter: Payer: Self-pay | Admitting: Family Medicine

## 2012-12-01 VITALS — BP 128/87 | HR 85 | Temp 98.8°F | Ht 64.0 in | Wt 214.0 lb

## 2012-12-01 DIAGNOSIS — N926 Irregular menstruation, unspecified: Secondary | ICD-10-CM

## 2012-12-01 MED ORDER — FLUTICASONE-SALMETEROL 250-50 MCG/DOSE IN AEPB: 1.0000 | INHALATION_SPRAY | Freq: Every day | RESPIRATORY_TRACT | Status: DC

## 2012-12-01 MED ORDER — NORGESTIM-ETH ESTRAD TRIPHASIC 0.18/0.215/0.25 MG-25 MCG PO TABS: 1.0000 | ORAL_TABLET | Freq: Every day | ORAL | Status: DC

## 2012-12-01 MED ORDER — ALBUTEROL SULFATE HFA 108 (90 BASE) MCG/ACT IN AERS: 1.0000 | INHALATION_SPRAY | RESPIRATORY_TRACT | Status: DC | PRN

## 2012-12-01 MED ORDER — NORGESTIM-ETH ESTRAD TRIPHASIC 0.18/0.215/0.25 MG-35 MCG PO TABS: 1.0000 | ORAL_TABLET | Freq: Every day | ORAL | Status: DC

## 2012-12-01 NOTE — Patient Instructions (Signed)
Please start taking the birth control pills- try setting an alarm on your phone so you remember.  I will see you in one month to remove the Implanon.

## 2012-12-02 NOTE — Progress Notes (Signed)
  Subjective:    Patient ID: Erica [name], female    DOB: 1986-09-13, 26 y.o.   MRN: [id number]  HPI  Erica [name] comes in with continued issues with irregular menses since having Implanon placed.  She says that she will have spotting in the middle of a month, and then sometimes her period will last for two weeks.  She says she wants to have the Implanon removed, but wants to make sure she is protected against pregnancy with birth control pills first.  She did try taking 3 months of birth control pills to help with the irregular bleeding, which helped some, but when she stopped them the irregularity got worse again.  She has not taken OCP's in several months.  Denies fatigue, dizziness.   Review of Systems Pertinent items in HPI    Objective:   Physical Exam BP 128/87  Pulse 85  Temp 98.8 F (37.1 C) (Oral)  Ht 5\' 4"  (1.626 m)  Wt 214 lb (97.07 kg)  BMI 36.73 kg/m2 General appearance: alert, cooperative and no distress Left Arm: .2mm scar visible in triceps/ibicipital groove, Implanon palpable.        Assessment & Plan:

## 2012-12-02 NOTE — Assessment & Plan Note (Signed)
Patient has had implanon for >1 year, with continued irregular menses. Will start OCP's, and have her come back in 1 month to have Implanon removed.

## 2012-12-31 ENCOUNTER — Ambulatory Visit (INDEPENDENT_AMBULATORY_CARE_PROVIDER_SITE_OTHER): Payer: BC Managed Care – PPO | Admitting: Family Medicine

## 2012-12-31 VITALS — BP 122/80 | HR 76 | Temp 98.5°F | Ht 64.0 in | Wt 212.0 lb

## 2012-12-31 DIAGNOSIS — N926 Irregular menstruation, unspecified: Secondary | ICD-10-CM

## 2012-12-31 NOTE — Progress Notes (Signed)
PROCEDURE NOTE: Implanon Removal Indications: persistent break through bleeding  Patient given informed consent, signed copy in the chart.  Appropriate time out taken   The patient's  left  arm was prepped and draped in the usual sterile fashion.. The Implanon was palpated, and marked. Local anaesthesia obtained using 1.5 cc of 1% lidocaine with epinephrine, 10 cc's. 1cm incision made with 15 blade, subcutaneous tissue dissected with hemastat.  However, I was unable to find the implant with blind dissection.  Ultrasound was used with sterile Tegaderm, hypoechoic shadow of implanon was visualized with probe in transverse position.  With aid of [place], I was able to remove the Implanon with hemostat.  Less than 3 cc blood loss. The incision site covered steri strips and a pressure bandage to minimize bruising. There patient tolerated the procedure well. Aftercare instructions were given.

## 2012-12-31 NOTE — Patient Instructions (Signed)
Please keep the pressure dressing on for 24 hours.  Leave the steri strips on until they fall off.  You can shower but do not scrub them.   If you have a lot of drainage, bleeding, redness of the skin, pain, fever, chills, please call the office for an appointment.

## 2013-02-05 ENCOUNTER — Other Ambulatory Visit: Payer: Self-pay

## 2013-05-24 ENCOUNTER — Ambulatory Visit (INDEPENDENT_AMBULATORY_CARE_PROVIDER_SITE_OTHER): Payer: BC Managed Care – PPO | Admitting: Family Medicine

## 2013-05-24 ENCOUNTER — Other Ambulatory Visit (HOSPITAL_COMMUNITY)
Admission: RE | Admit: 2013-05-24 | Discharge: 2013-05-24 | Disposition: A | Discharge status: Home / Self Care | Payer: BC Managed Care – PPO | Source: Ambulatory Visit | Attending: Family Medicine | Admitting: Family Medicine

## 2013-05-24 ENCOUNTER — Telehealth: Payer: Self-pay | Admitting: Family Medicine

## 2013-05-24 VITALS — BP 119/79 | HR 79 | Temp 99.3°F | Ht 64.0 in | Wt 204.0 lb

## 2013-05-24 DIAGNOSIS — Z202 Contact with and (suspected) exposure to infections with a predominantly sexual mode of transmission: Secondary | ICD-10-CM

## 2013-05-24 DIAGNOSIS — Z113 Encounter for screening for infections with a predominantly sexual mode of transmission: Secondary | ICD-10-CM | POA: Insufficient documentation

## 2013-05-24 DIAGNOSIS — N898 Other specified noninflammatory disorders of vagina: Secondary | ICD-10-CM

## 2013-05-24 DIAGNOSIS — Z9189 Other specified personal risk factors, not elsewhere classified: Secondary | ICD-10-CM

## 2013-05-24 LAB — POCT WET PREP (WET MOUNT)

## 2013-05-24 MED ORDER — METRONIDAZOLE 500 MG PO TABS: 500.0000 mg | ORAL_TABLET | Freq: Two times a day (BID) | ORAL | Status: DC

## 2013-05-24 MED ORDER — FLUCONAZOLE 150 MG PO TABS: 150.0000 mg | ORAL_TABLET | Freq: Once | ORAL | Status: DC

## 2013-05-24 NOTE — Assessment & Plan Note (Signed)
Wet prep and GC/Chlamydia obtained.  Will notify of results.

## 2013-05-24 NOTE — Patient Instructions (Addendum)
It was nice to meet you today, Erica [name]. Please refer to handout below regarding common causes of vaginitis. It typically takes about 1-2 days for results to return. If today's lab results are ABNORMAL, we will call you and send medication to your pharmacy.   If you develop worsening symptoms, fever (temperature > 101.5 degrees), nausea/vomiting, or pelvic pain, please return to clinic.  What is vaginitis? Vaginitis is an inflammation of the vagina. Vulvovaginitis refers to inflammation of both the vagina and vulva (the external female genitals).   What are the most common types of vaginitis? The six most common types of vaginitis are:  Candida or "yeast" vaginitis  Bacterial vaginosis  Trichomoniasis vaginitis (a sexually transmitted infection)   What are candida or "yeast" infections? Yeast infections of the vagina are what most women think of when they hear the term "vaginitis." Yeast infections are caused by one of the many species of fungus called candida. Candida normally live in small numbers in the vagina, as well as in the mouth and digestive tract of both men and women.  Yeast infections produce a thick, white vaginal discharge with the consistency of cottage cheese. Although the discharge can be somewhat watery, it is odorless. Yeast infections usually cause the vagina and the vulva to be very itchy and red, even before the onset of discharge.  If yeast is normal in a woman's vagina, what makes it cause an infection? Usually, infection occurs when a change in the delicate balance in a woman's system takes place.   What is bacterial vaginosis? Although "yeast" is the name most women know, bacterial vaginosis (BV) actually is the most common vaginal infection in women of reproductive age. Bacterial vaginosis often will cause a vaginal discharge. The discharge usually is thin and milky, and is described as having a "fishy" odor. This odor may become more noticeable after intercourse.   Redness or itching of the vagina are not common symptoms of bacterial vaginosis. Some women with BV have no symptoms at all, and the vaginitis is only discovered during a routine gynecologic exam. Bacterial vaginosis is caused by a combination of several bacteria. These bacteria seem to overgrow in much the same way as do candida when the vaginal pH balance is upset.  Because bacterial vaginosis is caused by bacteria and not by yeast, medicine that is appropriate for yeast is not effective against the bacteria that cause bacterial vaginosis.  BV is treated in asymptomatic females of child bearing age to decrease their risk of preterm delivery.  Risk factors for BV include:  New or multiple sexual partners  Douching  Cigarette smoking  What is trichomoniasis? Trichomoniasis - Trichomoniasis is caused by a tiny single-celled organism known as a "protozoa." When this organism infects the vagina, it can cause a frothy, greenish-yellow discharge. Often this discharge will have a foul smell. Women with trichomonal vaginitis may complain of itching and soreness of the vagina and vulva, as well as burning during urination. In addition, there can be discomfort in the lower abdomen and vaginal pain with intercourse. These symptoms may be worse after the menstrual period. Many women, however, do not develop any symptoms. It is important to understand that this type of vaginitis can be transmitted through sexual intercourse. For treatment to be effective, the sexual partner must be treated at the same time as the patient.

## 2013-05-24 NOTE — Progress Notes (Signed)
  Subjective:    Erica [name] is a 27 y.o. female who presents for sexually transmitted disease check. Sexual history reviewed with the patient. STI Exposure: denies knowledge of risky exposure, but would like to be checked today.  She is sexually active with one partner, husband.  Previous history of trichomonas and  BV.  Current symptoms vaginal discharge: clear, thin and malodorous, vaginal irritation: moderate itching.  Symptoms started about one week ago.  Contraception: OCP (estrogen/progesterone)  Patient denies any nausea/vomiting.  Denies any pelvic pain, but does endorse low back pain that started yesterday.  This is getting better.  Normal appetite.   Patient's last menstrual period was 05/17/2013.   Review of Systems Pertinent items are noted in HPI.    Objective:    BP 119/79  Pulse 79  Temp(Src) 99.3 F (37.4 C) (Oral)  Ht 5\' 4"  (1.626 m)  Wt 204 lb (92.534 kg)  BMI 35 kg/m2  LMP 05/17/2013 General:   alert, cooperative and no distress  Pelvis:  Vulva and vagina appear normal. Bimanual exam reveals normal uterus and adnexa. External genitalia: normal general appearance Vaginal: discharge, white Cervix: cervicitis  Cultures:  GC and Chlamydia genprobes and wet prep     Assessment:    Vaginitis and STD check   Plan:

## 2013-05-24 NOTE — Telephone Encounter (Signed)
Called patient with results and sent Rx to pharmacy.

## 2013-05-25 ENCOUNTER — Encounter: Payer: Self-pay | Admitting: Family Medicine

## 2013-08-23 ENCOUNTER — Ambulatory Visit (INDEPENDENT_AMBULATORY_CARE_PROVIDER_SITE_OTHER): Payer: BC Managed Care – PPO | Admitting: Family Medicine

## 2013-08-23 ENCOUNTER — Encounter: Payer: Self-pay | Admitting: Family Medicine

## 2013-08-23 ENCOUNTER — Other Ambulatory Visit (HOSPITAL_COMMUNITY)
Admission: RE | Admit: 2013-08-23 | Discharge: 2013-08-23 | Disposition: A | Discharge status: Home / Self Care | Payer: BC Managed Care – PPO | Source: Ambulatory Visit | Attending: Family Medicine | Admitting: Family Medicine

## 2013-08-23 ENCOUNTER — Other Ambulatory Visit: Payer: Self-pay | Admitting: Family Medicine

## 2013-08-23 VITALS — BP 121/83 | HR 81 | Temp 98.4°F | Ht 64.0 in | Wt 224.0 lb

## 2013-08-23 DIAGNOSIS — Z01419 Encounter for gynecological examination (general) (routine) without abnormal findings: Secondary | ICD-10-CM

## 2013-08-23 DIAGNOSIS — N923 Ovulation bleeding: Secondary | ICD-10-CM

## 2013-08-23 DIAGNOSIS — Z113 Encounter for screening for infections with a predominantly sexual mode of transmission: Secondary | ICD-10-CM | POA: Insufficient documentation

## 2013-08-23 DIAGNOSIS — N898 Other specified noninflammatory disorders of vagina: Secondary | ICD-10-CM

## 2013-08-23 DIAGNOSIS — Z124 Encounter for screening for malignant neoplasm of cervix: Secondary | ICD-10-CM

## 2013-08-23 DIAGNOSIS — M545 Low back pain: Secondary | ICD-10-CM

## 2013-08-23 DIAGNOSIS — Z0000 Encounter for general adult medical examination without abnormal findings: Secondary | ICD-10-CM | POA: Insufficient documentation

## 2013-08-23 DIAGNOSIS — R52 Pain, unspecified: Secondary | ICD-10-CM

## 2013-08-23 DIAGNOSIS — N939 Abnormal uterine and vaginal bleeding, unspecified: Secondary | ICD-10-CM

## 2013-08-23 DIAGNOSIS — N921 Excessive and frequent menstruation with irregular cycle: Secondary | ICD-10-CM

## 2013-08-23 LAB — POCT URINALYSIS DIPSTICK
Bilirubin, UA: NEGATIVE
Glucose, UA: NEGATIVE
Spec Grav, UA: 1.025
Urobilinogen, UA: 1
pH, UA: 7

## 2013-08-23 LAB — POCT WET PREP (WET MOUNT): WBC, Wet Prep HPF POC: >20

## 2013-08-23 LAB — POCT UA - MICROSCOPIC ONLY

## 2013-08-23 MED ORDER — METRONIDAZOLE 500 MG PO TABS: 500.0000 mg | ORAL_TABLET | Freq: Two times a day (BID) | ORAL | Status: DC

## 2013-08-23 NOTE — Progress Notes (Signed)
  Subjective:    Patient ID: Erica Erica [name], female    DOB: 1986/08/20, 27 y.o.   MRN: [id number]  HPI Patient is a 27 yo female who presents for CPE.  Complains of pain in right side. Only occurs following drinking soda. Usually goes away with urination. Only occurs in the morning. Has had intermittently for 5 years. Is worse now. Assumed it was because she was holding her urine, though thinks it may be related to sodas. Has not tried any treatment other than stopping soda. Has never had this when does not drink soda.  VAGINAL DISCHARGE  Onset: several days ago Description: milky brown Odor: "bad smell" Itching: mild  Symptoms Dysuria: no  Bleeding: yes, spotting the past few days  Pelvic pain: no  Back pain: no  Genital sores: no  Rash: no  Dyspareunia: no  Prior treatment: yes, history of recurrent BV treated with flagyl   Red Flags: Missed period: no, last period finished about 1.5 weeks ago, now with spotting though, typically has regular periods on OCPs, though has history of irregular periods Pregnancy: no  Sexual activity: yes, one partner (husband)  Possible STD exposure: no  IUD: no  Diabetes: no   PMH: denies smoking and alcohol use  Review of Systems  Do you feel safe in relationships? yes PHQ-2:negative  Review of Symptoms-see HPI, otherwise per below  General:  Negative for nexplained weight loss, fever Skin: Negative for new or changing Erica [name], sore that won't heal HEENT: Negative for trouble hearing, trouble seeing, ringing in ears, mouth sores, hoarseness, change in voice, dysphagia. CV:  Negative for chest pain, dyspnea, edema, palpitations Resp: Negative for cough, dyspnea, hemoptysis GI: Negative for nausea, vomiting, diarrhea, constipation, abdominal pain, melena, hematochezia. GU: Negative for dysuria, incontinence, urinary hesitance, hematuria, polyuria, sexual difficulty, lumps in testicle or breasts MSK: Negative for muscle cramps or aches,  joint pain or swelling Neuro: Negative for headaches, weakness, numbness, dizziness, passing out/fainting Psych: Negative for depression, anxiety, memory problems      Objective:   Physical Exam  Constitutional: She appears well-developed and well-nourished.  HENT:  Head: Normocephalic and atraumatic.  Eyes: Conjunctivae are normal. Pupils are equal, round, and reactive to light.  Neck: Neck supple.  Cardiovascular: Normal rate, regular rhythm and normal heart sounds.   Pulmonary/Chest: Effort normal and breath sounds normal.  Abdominal: Soft. Bowel sounds are normal. She exhibits no distension. There is no tenderness. There is no rebound.  Genitourinary: Vaginal discharge found.  Patient with milky discharge, minimal bleeding, no lesions or masses, bimanual without tenderness or masses felt  Musculoskeletal: She exhibits no edema and no tenderness (no TTP on right side, no CV tenderness or flank tenderness).  Lymphadenopathy:    She has no cervical adenopathy.  Neurological: She is alert.  Skin: Skin is warm and dry.  BP 121/83  Pulse 81  Temp(Src) 98.4 F (36.9 C) (Oral)  Ht 5\' 4"  (1.626 m)  Wt 224 lb (101.606 kg)  BMI 38.43 kg/m2     Assessment & Plan:

## 2013-08-23 NOTE — Addendum Note (Signed)
Addended by: [place], Aaniya Sterba on: 08/23/2013 03:28 PM   Modules accepted: Orders

## 2013-08-23 NOTE — Assessment & Plan Note (Signed)
Patient with milky vaginal discharge. Given history of BV, suspect this to be the culprit. Will check wet prep, GC/Chlam. Will call with results and treat accordingly.

## 2013-08-23 NOTE — Assessment & Plan Note (Signed)
In right low back and right low side associated with drinking soda. Does not occur when does not drink soda. Unsure how soda would cause this type of pain, but given that has not occurred since stopped drinking soda I suspect there is not an urgent cause of this issue. Will check a UA.

## 2013-08-23 NOTE — Patient Instructions (Signed)
Nice to meet you today. We collected several tests today, a pap smear, urine pregnancy test, wet prep (to look for BV, yeast, and trichomonas), gonorrhea and chlamydia, and urinalysis. We will call you with the results. Your spotting is most likely related to your birth control, though if this continues we will need to see you back.

## 2013-08-23 NOTE — Assessment & Plan Note (Signed)
Doing well. Pap smear done. Will call with results.

## 2013-08-23 NOTE — Assessment & Plan Note (Signed)
Patient with recent onset of spotting. Finished period about 1.5 weeks ago. Suspect this is related to her OCPs vs infection. Infectious work up per discharge problem. Will obtain Upreg. To see in f/u if not improved.

## 2013-08-26 ENCOUNTER — Encounter: Payer: Self-pay | Admitting: Family Medicine

## 2013-10-27 ENCOUNTER — Encounter: Payer: Self-pay | Admitting: Family Medicine

## 2013-10-27 ENCOUNTER — Ambulatory Visit (INDEPENDENT_AMBULATORY_CARE_PROVIDER_SITE_OTHER): Payer: BC Managed Care – PPO | Admitting: Family Medicine

## 2013-10-27 ENCOUNTER — Other Ambulatory Visit: Payer: Self-pay

## 2013-10-27 VITALS — BP 144/79 | HR 72 | Temp 99.6°F | Ht 64.0 in | Wt 216.0 lb

## 2013-10-27 DIAGNOSIS — N898 Other specified noninflammatory disorders of vagina: Secondary | ICD-10-CM

## 2013-10-27 LAB — POCT WET PREP (WET MOUNT)

## 2013-10-27 MED ORDER — METRONIDAZOLE 500 MG PO TABS: 500.0000 mg | ORAL_TABLET | Freq: Two times a day (BID) | ORAL | Status: DC

## 2013-10-27 NOTE — Addendum Note (Signed)
Addended by: Wayne Both on: 10/27/2013 01:47 PM   Modules accepted: Orders

## 2013-10-27 NOTE — Assessment & Plan Note (Addendum)
Wet prep obtained during the visit. Plan will be pending on results. BV per wet prep. Metronidazole has been sent to pharmacy and pt was contacted via phone call.

## 2013-10-27 NOTE — Progress Notes (Signed)
Family Medicine Office Visit Note   Subjective:   Patient ID: Erica [name], female  DOB: [birth year], 27 y.o.. MRN: [id number]   Pt that comes today for same day appointment complaining of vaginal discharge. She reports history of BV and has noticed new vaginal discharge in the past weeks. She denies bad odor but reports mild itchiness. She is sexually active but has no concerns for STDs. She denies fever, nausea, vomiting or pelvic/abdominal pain.   Review of Systems:  Per history of present illness  Objective:   Physical Exam: Gen:  NAD HEENT: Moist mucous membranes. Normal oropharynx, no exudates ABD: Soft, non tender, non distended, normal bowel sounds Vulva and perianal area: Normal  Speculum: Vagina and cervix of normal appearance, no friability, abundant grayish discharge. Bimanual exam: Uterus anteverted, no adnexal masses. No cervical motion tenderness.  Assessment & Plan:

## 2013-10-27 NOTE — Patient Instructions (Signed)
It has been a pleasure to see you today. I will call you with the labs results if they come back abnormal otherwise you will receive a letter.   

## 2013-10-31 ENCOUNTER — Other Ambulatory Visit: Payer: Self-pay | Admitting: Family Medicine

## 2013-11-03 ENCOUNTER — Ambulatory Visit: Payer: BC Managed Care – PPO | Admitting: Family Medicine

## 2013-12-21 ENCOUNTER — Other Ambulatory Visit: Payer: Self-pay | Admitting: Family Medicine

## 2013-12-24 NOTE — Telephone Encounter (Signed)
Refilled one inhaler. Patient needs follow-up for this issue prior to additional refills of albuterol. Please inform the patient.

## 2013-12-26 NOTE — Telephone Encounter (Signed)
Pt is aware.  Erica [name],CMA  

## 2014-05-17 ENCOUNTER — Telehealth: Payer: Self-pay | Admitting: *Deleted

## 2014-05-17 MED ORDER — NORGESTIM-ETH ESTRAD TRIPHASIC 0.18/0.215/0.25 MG-35 MCG PO TABS: ORAL_TABLET | ORAL | Status: DC

## 2014-05-17 NOTE — Telephone Encounter (Signed)
Medication refilled. Patient should be advised to come to office for visit for next set of refills. Thanks.

## 2014-05-18 NOTE — Telephone Encounter (Signed)
Pt informed that refill for Tri-Sprintec was filled, but need to schedule an office visit before any more medications will be refilled.  Pt stated understanding.  Clovis Pu, RN

## 2014-08-11 ENCOUNTER — Encounter: Payer: Self-pay | Admitting: *Deleted

## 2014-08-17 ENCOUNTER — Ambulatory Visit (INDEPENDENT_AMBULATORY_CARE_PROVIDER_SITE_OTHER): Payer: BC Managed Care – PPO | Admitting: Family Medicine

## 2014-08-17 ENCOUNTER — Encounter: Payer: Self-pay | Admitting: Family Medicine

## 2014-08-17 VITALS — BP 120/82 | HR 76 | Ht 64.0 in | Wt 221.0 lb

## 2014-08-17 DIAGNOSIS — Z3009 Encounter for other general counseling and advice on contraception: Secondary | ICD-10-CM

## 2014-08-17 DIAGNOSIS — R21 Rash and other nonspecific skin eruption: Secondary | ICD-10-CM

## 2014-08-17 MED ORDER — TRIAMCINOLONE ACETONIDE 0.1 % EX CREA: 1.0000 "application " | TOPICAL_CREAM | Freq: Two times a day (BID) | CUTANEOUS | Status: DC

## 2014-08-17 MED ORDER — NORGESTIM-ETH ESTRAD TRIPHASIC 0.18/0.215/0.25 MG-35 MCG PO TABS: ORAL_TABLET | ORAL | Status: DC

## 2014-08-17 MED ORDER — MUPIROCIN 2 % EX OINT: 1.0000 "application " | TOPICAL_OINTMENT | Freq: Two times a day (BID) | CUTANEOUS | Status: DC

## 2014-08-17 NOTE — Patient Instructions (Signed)
Nice to see you. We have prescribed an antibiotic ointment and steroid cream to apply to your rash. Please apply these twice a day. If the rash does not improve, your develop fevers, or the redness spreads please let us know.

## 2014-08-17 NOTE — Progress Notes (Signed)
Patient ID: Erica [name], female   DOB: July 09, 1986, 28 y.o.   MRN: [id number]  Erica Alar, MD Phone: [phone]  Erica [name] is a 28 y.o. female who presents today for f/u.  Contraception management: has been taking OCP. Periods monthly lasting for 5 days. She notes taking the pill every day at the same time. Does not smoke and no history of clots. She is sexually active with one partner. They do not use condoms.   Rash: notes bumps on arms over the last few days. These itch. Her children have a similar rash. She also notes a rash on her inner thighs bilaterally over the past day. These occurred after she got sweaty. She notes the area is red. The area hurts only when she has pants on. Does not shave her legs. No one else in the house has this leg rash. No recent hotel stays.  Patient is a nonsmoker.   ROS: Per HPI   Physical Exam Filed Vitals:   08/17/14 1526  BP: 120/82  Pulse: 76    Gen: Well NAD HEENT: PERRL,  MMM Lungs: CTABL Nl WOB Heart: RRR no MRG Skin: 1-2 mm red papules scattered on lower arms, bilateral inner thighs in are where thighs contact each other, with a patch of erythema with scattered pustules, no induration, no streaking, no fluctuance Exts: Non edematous BL  LE, warm and well perfused.    Assessment/Plan: Please see individual problem list.  # Healthcare maintenance: needs flu shot when available  Erica Alar, MD [facility] PGY-3

## 2014-08-19 DIAGNOSIS — Z3009 Encounter for other general counseling and advice on contraception: Secondary | ICD-10-CM | POA: Insufficient documentation

## 2014-08-19 DIAGNOSIS — R21 Rash and other nonspecific skin eruption: Secondary | ICD-10-CM | POA: Insufficient documentation

## 2014-08-19 NOTE — Assessment & Plan Note (Signed)
Oral contraceptives refilled. Patient to follow-up in 2 years for pap smear.

## 2014-08-19 NOTE — Assessment & Plan Note (Signed)
Patient with 2 different rashes. The rash on her legs is likely a result of friction and irritation relating to her thighs rubbing together. Will treat with topical antibiotic and steroid. She can use the topical steroid on the lesions on her arms. The rash on her legs does not appear to be a cellulitis. Given return precautions. F/u prn.

## 2014-10-23 ENCOUNTER — Encounter: Payer: Self-pay | Admitting: Family Medicine

## 2015-01-08 ENCOUNTER — Other Ambulatory Visit (HOSPITAL_COMMUNITY)
Admission: RE | Admit: 2015-01-08 | Discharge: 2015-01-08 | Disposition: A | Discharge status: Home / Self Care | Payer: BLUE CROSS/BLUE SHIELD | Source: Ambulatory Visit | Attending: Family Medicine | Admitting: Family Medicine

## 2015-01-08 ENCOUNTER — Encounter: Payer: Self-pay | Admitting: Family Medicine

## 2015-01-08 ENCOUNTER — Ambulatory Visit (INDEPENDENT_AMBULATORY_CARE_PROVIDER_SITE_OTHER): Payer: BLUE CROSS/BLUE SHIELD | Admitting: Family Medicine

## 2015-01-08 VITALS — BP 129/90 | HR 78 | Temp 98.6°F | Ht 64.0 in | Wt 223.0 lb

## 2015-01-08 DIAGNOSIS — N898 Other specified noninflammatory disorders of vagina: Secondary | ICD-10-CM

## 2015-01-08 DIAGNOSIS — Z113 Encounter for screening for infections with a predominantly sexual mode of transmission: Secondary | ICD-10-CM | POA: Diagnosis present

## 2015-01-08 DIAGNOSIS — N939 Abnormal uterine and vaginal bleeding, unspecified: Secondary | ICD-10-CM | POA: Diagnosis not present

## 2015-01-08 DIAGNOSIS — N923 Ovulation bleeding: Secondary | ICD-10-CM

## 2015-01-08 DIAGNOSIS — Z01419 Encounter for gynecological examination (general) (routine) without abnormal findings: Secondary | ICD-10-CM | POA: Diagnosis not present

## 2015-01-08 LAB — POCT WET PREP (WET MOUNT): CLUE CELLS WET PREP WHIFF POC: NEGATIVE

## 2015-01-08 LAB — POCT URINE PREGNANCY: Preg Test, Ur: NEGATIVE

## 2015-01-08 NOTE — Assessment & Plan Note (Signed)
GC/chlamydia and wet prep done. Will f/u results and treat accordingly.

## 2015-01-08 NOTE — Progress Notes (Signed)
Patient ID: Valetta MoleMargaret Ewing [name], female   DOB: 08/31/1986, 29 y.o.   MRN: [id number]  Erica AlarEric Ishmel Acevedo, MD Phone: [phone]  Erica [name] is a 29 y.o. female who presents today for same day appointment.  Vaginal bleeding: notes since she started on new type of OCP she has had issues with spotting during the week leading up to her period. She notes a small amount of spotting daily during that time period. Then will have a normal period. Period lasts 5 days and uses 4 pads daily. Has one period per month. Did not have this issue with sprintec.   Vaginal discharge: notes she has yellow discharge with an odor. States this is like her previous BV discharge. Notes itching. Sexually active with one partner. Do not use condoms. Has a history of trichomonas.   Patient is a nonsmoker.   ROS: Per HPI   Physical Exam Filed Vitals:   01/08/15 1031  BP: 129/90  Pulse: 78  Temp: 98.6 F (37 C)    Gen: Well NAD HEENT: PERRL,  MMM Lungs: CTABL Nl WOB Heart: RRR  Abd: soft, NT, ND GU: normal labia, normal vaginal mucosa, cervix with circular area of friability and apparent superficial blood vessels in a 1 cm radius from the cervical os, there was easy bleeding with manipulation of the cervix   Assessment/Plan: Please see individual problem list.  Erica AlarEric Tashae Inda, MD [facility] PGY[facility]

## 2015-01-08 NOTE — Patient Instructions (Signed)
Nice to see you. We will call with the results of your tests.  We would like for you to be seen in our colposcopy clinic for further evaluation.

## 2015-01-08 NOTE — Assessment & Plan Note (Addendum)
Patient with several months of spotting in the week leading up to her periods. This may be related to her being on OCPs especially given time course, though the finding of a friable cervix on exam makes me wonder if the bleeding is related to that issue. Pap smear collected today. She needs to return for a colposcopy to have this are further evaluated. Testing for GC/chlamydia and wet prep done today as infection could also explain this bleeding. Will continue OCPs at this time and consider change in birth control if above work up is negative.   Precepted with Dr Mauricio PoBreen

## 2015-01-10 LAB — GC/CHLAMYDIA PROBE AMP (~~LOC~~) NOT AT ARMC
Chlamydia: NEGATIVE
Neisseria Gonorrhea: NEGATIVE

## 2015-01-10 LAB — CYTOLOGY - PAP

## 2015-01-11 ENCOUNTER — Telehealth: Payer: Self-pay | Admitting: *Deleted

## 2015-01-11 NOTE — Telephone Encounter (Signed)
Pt is aware of results and colpo is scheduled for 01/18/15. Lance Galas,CMA

## 2015-01-11 NOTE — Telephone Encounter (Signed)
-----   Message from Glori LuisEric G Sonnenberg, MD sent at 01/11/2015 11:34 AM EST ----- Please let the patient know that her testing was negative for infectious cause of this issue. Her pap smear was also normal. She should keep the appointment with colpo clinic for further evaluation.

## 2015-01-18 ENCOUNTER — Ambulatory Visit (INDEPENDENT_AMBULATORY_CARE_PROVIDER_SITE_OTHER): Payer: BLUE CROSS/BLUE SHIELD | Admitting: Family Medicine

## 2015-01-18 VITALS — BP 151/105 | HR 81 | Temp 98.3°F | Ht 64.0 in | Wt 221.5 lb

## 2015-01-18 DIAGNOSIS — N923 Ovulation bleeding: Secondary | ICD-10-CM

## 2015-01-18 DIAGNOSIS — Z309 Encounter for contraceptive management, unspecified: Secondary | ICD-10-CM | POA: Diagnosis not present

## 2015-01-18 DIAGNOSIS — N939 Abnormal uterine and vaginal bleeding, unspecified: Secondary | ICD-10-CM | POA: Diagnosis not present

## 2015-01-18 DIAGNOSIS — Z3009 Encounter for other general counseling and advice on contraception: Secondary | ICD-10-CM

## 2015-01-18 DIAGNOSIS — N72 Inflammatory disease of cervix uteri: Secondary | ICD-10-CM | POA: Diagnosis not present

## 2015-01-18 MED ORDER — AZITHROMYCIN 250 MG PO TABS: ORAL_TABLET | ORAL | Status: DC

## 2015-01-18 NOTE — Progress Notes (Signed)
   Subjective:    Patient ID: Erica [name], female    DOB: 05/22/1986, 29 y.o.   MRN: [id number]  HPI Here for further eval of abnormal appearing cervix on recent pap smear, spotting in the week before her period since she restarted OCps. Had been on OCP previously (2013-2014--Ortho Tri Cyclen).  Was started on tri-Sprintec in August 2015 and had several months of spotting in week before cycle. Notabley, since last ov and in last cycle/month she did not have any abnormal spotting. Has had problems with BV inpast and was tested for STD and wet prep at last OV (negative). Not currrently haiving any vaginal d/c, no dyspareunia.   Review of Systems See HPI. Additionally, no fever, no abdomial pain, no genital rashes.     Objective:   Physical Exam GEN: WD WN NAD GU: externally normal, no lesions, no rash. COLPOSCOPY: Patient given informed consent, signed copy in the chart.  Placed in lithotomy position. Cervix viewed with speculum and colposcope after application of acetic acid. Colposcopy adequate (entire squamocolumnar junctions seen  in entirety) ?  Yes Cervix appears erythematous in diffuse pattern that envelopes area outside the SCJ. Somewhat ill defined, no lesions, vessels appear slightly prominent. Acetowhite lesions?mo Punctation?no Mosaicism?  no Abnormal vasculature?  no Biopsies?no ECC?no Complications? no  COMMENTS:I reviewed her recent pap and labs which included Gc/Chlamydia and wet prep --all negative. I wonder if she is having some subacute or chronic cervicitis from other etiology (such as mycoplasma).  ASSESSMENT: PLAN 1. Abnormal vaginal spotting since restarting (new) OCP. I suspect this was related to the resatrt of oral contraceptives although there is some slight difference in ortho tricyclen and ortho sprintec. Either way it seems to have resolved. Recommend continue current OCP as directed. 2. Abnormal cervical exam; most consistent with chronic or subacute  cervicitis. Recent STD testing negative. Recent pap normal. Wll treat with azithromycin (empiric tx other cervical pathogens such as mycoplasma). Follow up if return of spotting or new symptoms. No testing available to test for cure of other GU pathogens and we discussed this.        Assessment & Plan:

## 2015-03-20 ENCOUNTER — Other Ambulatory Visit: Payer: Self-pay | Admitting: Family Medicine

## 2015-03-21 ENCOUNTER — Ambulatory Visit (INDEPENDENT_AMBULATORY_CARE_PROVIDER_SITE_OTHER): Payer: BLUE CROSS/BLUE SHIELD | Admitting: Family Medicine

## 2015-03-21 ENCOUNTER — Encounter: Payer: Self-pay | Admitting: Family Medicine

## 2015-03-21 VITALS — BP 116/80 | HR 98 | Temp 98.4°F | Ht 63.0 in | Wt 218.0 lb

## 2015-03-21 DIAGNOSIS — J4521 Mild intermittent asthma with (acute) exacerbation: Secondary | ICD-10-CM

## 2015-03-21 DIAGNOSIS — J45901 Unspecified asthma with (acute) exacerbation: Secondary | ICD-10-CM | POA: Insufficient documentation

## 2015-03-21 MED ORDER — PREDNISONE 50 MG PO TABS: ORAL_TABLET | ORAL | Status: DC

## 2015-03-21 MED ORDER — FLUTICASONE-SALMETEROL 250-50 MCG/DOSE IN AEPB: 1.0000 | INHALATION_SPRAY | Freq: Every day | RESPIRATORY_TRACT | Status: DC

## 2015-03-21 MED ORDER — FLUTICASONE PROPIONATE 50 MCG/ACT NA SUSP: 1.0000 | Freq: Every day | NASAL | Status: DC

## 2015-03-21 NOTE — Patient Instructions (Signed)
It was good to see you. I agree that this is an asthma exacerbation. Use prednisone daily for 5 days. Take albuterol every 4 hours for the next 2 days and then every 4 hours as needed. Take Advair every day. I have sent this in again, but if it is still expensive, please ask the pharmacist to check on any alternative would be covered by insurance. Please ask them to check on the Flonase and albuterol as well if this ends up being expensive. There may be less expensive options. It is important to be very consistent with your Advair to prevent flareups. However, sometimes it is difficult to avoid. Follow-up with your primary doctor in 2 weeks about this. Be sure to get a flu shot every October.  Asthma Attack Prevention Although there is no way to prevent asthma from starting, you can take steps to control the disease and reduce its symptoms. Learn about your asthma and how to control it. Take an active role to control your asthma by working with your health care provider to create and follow an asthma action plan. An asthma action plan guides you in:  Taking your medicines properly.  Avoiding things that set off your asthma or make your asthma worse (asthma triggers).  Tracking your level of asthma control.  Responding to worsening asthma.  Seeking emergency care when needed. To track your asthma, keep records of your symptoms, check your peak flow number using a handheld device that shows how well air moves out of your lungs (peak flow meter), and get regular asthma checkups.  WHAT ARE SOME WAYS TO PREVENT AN ASTHMA ATTACK?  Take medicines as directed by your health care provider.  Keep track of your asthma symptoms and level of control.  With your health care provider, write a detailed plan for taking medicines and managing an asthma attack. Then be sure to follow your action plan. Asthma is an ongoing condition that needs regular monitoring and treatment.  Identify and avoid asthma  triggers. Many outdoor allergens and irritants (such as pollen, mold, cold air, and air pollution) can trigger asthma attacks. Find out what your asthma triggers are and take steps to avoid them.  Monitor your breathing. Learn to recognize warning signs of an attack, such as coughing, wheezing, or shortness of breath. Your lung function may decrease before you notice any signs or symptoms, so regularly measure and record your peak airflow with a home peak flow meter.  Identify and treat attacks early. If you act quickly, you are less likely to have a severe attack. You will also need less medicine to control your symptoms. When your peak flow measurements decrease and alert you to an upcoming attack, take your medicine as instructed and immediately stop any activity that may have triggered the attack. If your symptoms do not improve, get medical help.  Pay attention to increasing quick-relief inhaler use. If you find yourself relying on your quick-relief inhaler, your asthma is not under control. See your health care provider about adjusting your treatment. WHAT CAN MAKE MY SYMPTOMS WORSE? A number of common things can set off or make your asthma symptoms worse and cause temporary increased inflammation of your airways. Keep track of your asthma symptoms for several weeks, detailing all the environmental and emotional factors that are linked with your asthma. When you have an asthma attack, go back to your asthma diary to see which factor, or combination of factors, might have contributed to it. Once you know what these  factors are, you can take steps to control many of them. If you have allergies and asthma, it is important to take asthma prevention steps at home. Minimizing contact with the substance to which you are allergic will help prevent an asthma attack. Some triggers and ways to avoid these triggers are: Animal Dander:  Some people are allergic to the flakes of skin or dried saliva from animals  with fur or feathers.   There is no such thing as a hypoallergenic dog or cat breed. All dogs or cats can cause allergies, even if they don't shed.  Keep these pets out of your home.  If you are not able to keep a pet outdoors, keep the pet out of your bedroom and other sleeping areas at all times, and keep the door closed.  Remove carpets and furniture covered with cloth from your home. If that is not possible, keep the pet away from fabric-covered furniture and carpets. Dust Mites: Many people with asthma are allergic to dust mites. Dust mites are tiny bugs that are found in every home in mattresses, pillows, carpets, fabric-covered furniture, bedcovers, clothes, stuffed toys, and other fabric-covered items.   Cover your mattress in a special dust-proof cover.  Cover your pillow in a special dust-proof cover, or wash the pillow each week in hot water. Water must be hotter than 130 F (54.4 C) to kill dust mites. Cold or warm water used with detergent and bleach can also be effective.  Wash the sheets and blankets on your bed each week in hot water.  Try not to sleep or lie on cloth-covered cushions.  Call ahead when traveling and ask for a smoke-free hotel room. Bring your own bedding and pillows in case the hotel only supplies feather pillows and down comforters, which may contain dust mites and cause asthma symptoms.  Remove carpets from your bedroom and those laid on concrete, if you can.  Keep stuffed toys out of the bed, or wash the toys weekly in hot water or cooler water with detergent and bleach. Cockroaches: Many people with asthma are allergic to the droppings and remains of cockroaches.   Keep food and garbage in closed containers. Never leave food out.  Use poison baits, traps, powders, gels, or paste (for example, boric acid).  If a spray is used to kill cockroaches, stay out of the room until the odor goes away. Indoor Mold:  Fix leaky faucets, pipes, or other  sources of water that have mold around them.  Clean floors and moldy surfaces with a fungicide or diluted bleach.  Avoid using humidifiers, vaporizers, or swamp coolers. These can spread molds through the air. Pollen and Outdoor Mold:  When pollen or mold spore counts are high, try to keep your windows closed.  Stay indoors with windows closed from late morning to afternoon. Pollen and some mold spore counts are highest at that time.  Ask your health care provider whether you need to take anti-inflammatory medicine or increase your dose of the medicine before your allergy season starts. Other Irritants to Avoid:  Tobacco smoke is an irritant. If you smoke, ask your health care provider how you can quit. Ask family members to quit smoking, too. Do not allow smoking in your home or car.  If possible, do not use a wood-burning stove, kerosene heater, or fireplace. Minimize exposure to all sources of smoke, including incense, candles, fires, and fireworks.  Try to stay away from strong odors and sprays, such as perfume, talcum  powder, hair spray, and paints.  Decrease humidity in your home and use an indoor air cleaning device. Reduce indoor humidity to below 60%. Dehumidifiers or central air conditioners can do this.  Decrease house dust exposure by changing furnace and air cooler filters frequently.  Try to have someone else vacuum for you once or twice a week. Stay out of rooms while they are being vacuumed and for a short while afterward.  If you vacuum, use a dust mask from a hardware store, a double-layered or microfilter vacuum cleaner bag, or a vacuum cleaner with a HEPA filter.  Sulfites in foods and beverages can be irritants. Do not drink beer or wine or eat dried fruit, processed potatoes, or shrimp if they cause asthma symptoms.  Cold air can trigger an asthma attack. Cover your nose and mouth with a scarf on cold or windy days.  Several health conditions can make asthma more  difficult to manage, including a runny nose, sinus infections, reflux disease, psychological stress, and sleep apnea. Work with your health care provider to manage these conditions.  Avoid close contact with people who have a respiratory infection such as a cold or the flu, since your asthma symptoms may get worse if you catch the infection. Wash your hands thoroughly after touching items that may have been handled by people with a respiratory infection.  Get a flu shot every year to protect against the flu virus, which often makes asthma worse for days or weeks. Also get a pneumonia shot if you have not previously had one. Unlike the flu shot, the pneumonia shot does not need to be given yearly. Medicines:  Talk to your health care provider about whether it is safe for you to take aspirin or non-steroidal anti-inflammatory medicines (NSAIDs). In a small number of people with asthma, aspirin and NSAIDs can cause asthma attacks. These medicines must be avoided by people who have known aspirin-sensitive asthma. It is important that people with aspirin-sensitive asthma read labels of all over-the-counter medicines used to treat pain, colds, coughs, and fever.  Beta-blockers and ACE inhibitors are other medicines you should discuss with your health care provider. HOW CAN I FIND OUT WHAT I AM ALLERGIC TO? Ask your asthma health care provider about allergy skin testing or blood testing (the RAST test) to identify the allergens to which you are sensitive. If you are found to have allergies, the most important thing to do is to try to avoid exposure to any allergens that you are sensitive to as much as possible. Other treatments for allergies, such as medicines and allergy shots (immunotherapy) are available.  CAN I EXERCISE? Follow your health care provider's advice regarding asthma treatment before exercising. It is important to maintain a regular exercise program, but vigorous exercise or exercise in cold,  humid, or dry environments can cause asthma attacks, especially for those people who have exercise-induced asthma. Document Released: 11/26/2009 Document Revised: 12/13/2013 Document Reviewed: 06/15/2013 [facility] Patient Information 2015 [place], [place]. This information is not intended to replace advice given to you by your health care provider. Make sure you discuss any questions you have with your health care provider.

## 2015-03-21 NOTE — Assessment & Plan Note (Signed)
Patient is satting well on room air and breathing easily, but lungs are tight with mild wheezing. Using albuterol multiple times daily, has not picked up Advair due to cost. Clearly an asthma exacerbation, likely triggered by allergic sinusitis. -Prednisone 5 day course -Albuterol every 4 hours 2 days, then every 4 hours when necessary -Discussed daily Advair. We'll represcribed, and patient to find out from pharmacy of this will be covered and if not what alternative would be and let us know. -Flonase daily especially allergy season -Avoid any triggers -Follow-up 2 weeks with PCP to evaluate control on this regimen -continue to get flu shots every year

## 2015-03-21 NOTE — Progress Notes (Signed)
Patient ID: Erica [name], female   DOB: 10/13/1986, 29 y.o.   MRN: [id number] Subjective:   CC: Shortness of breath  HPI:   Patient presents to sameday visit with 1 week of shortness of breath when she walks even from her room to the living room. At the start of symptoms, she had greenish sputum, which has now turned more yellowish. She has had sneezing, nasal congestion that is clearing up, back and rib pain from coughing, and initially was hearing wheezing. Shortness of breath has been waking her from sleep. She has been using her inhaler multiple times daily, and it helps for about one hour and then she felt short of breath again. Last night and today symptoms have felt slightly improved. She denies fevers or chills, neck stiffness, abdominal pain, nausea, vomiting, diarrhea, chest pain, or leg swelling. She has not had any tobacco exposure and did get her flu shot this season. She has not been able to take Advair due to cost of co-pay. She has not been seen by PCP for asthma for years and knows she needs to get back in.  Review of Systems - Per HPI.  PMH - intermenstrual bleeding, vaginal discharge, asthma Smoking status: Nonsmoker    Objective:  Physical Exam BP 116/80 mmHg  Pulse 98  Temp(Src) 98.4 F (36.9 C) (Oral)  Ht 5\' 3"  (1.6 m)  Wt 218 lb (98.884 kg)  BMI 38.63 kg/m2  SpO2 98%  LMP 03/14/2015 GEN: NAD Cardiovascular: Regular rate and rhythm, no murmurs rubs or gallops Pulmonary: Poor air entry throughout with mild wheezing, no shortness of breath or crackles Extremity: No lower edema or calf tenderness HEENT: Atraumatic, normocephalic, sclera clear, extraocular movements intact, TMs retracted with clear fluid, oropharynx clear, neck supple, no lymphadenopathy, no thyromegaly, no sinus tenderness, mild allergic shiners bilaterally  Assessment:     Erica MoleMargaret Ewing [name] is a 29 y.o. female here for shortness of breath.    Plan:     # See problem list and after  visit summary for problem-specific plans.   # Health Maintenance: Return to see PCP regularly for asthma.  Follow-up: Follow up in 2 weeks for follow-up on asthma exacerbation with PCP.     Leona SingletonMaria T Mame Twombly, MD [facility]

## 2015-04-10 ENCOUNTER — Ambulatory Visit (INDEPENDENT_AMBULATORY_CARE_PROVIDER_SITE_OTHER): Payer: BLUE CROSS/BLUE SHIELD | Admitting: Family Medicine

## 2015-04-10 ENCOUNTER — Encounter: Payer: Self-pay | Admitting: Family Medicine

## 2015-04-10 VITALS — BP 116/82 | HR 91 | Temp 98.6°F | Ht 63.0 in | Wt 223.0 lb

## 2015-04-10 DIAGNOSIS — J4540 Moderate persistent asthma, uncomplicated: Secondary | ICD-10-CM | POA: Diagnosis not present

## 2015-04-10 DIAGNOSIS — J4521 Mild intermittent asthma with (acute) exacerbation: Secondary | ICD-10-CM | POA: Diagnosis not present

## 2015-04-10 MED ORDER — BECLOMETHASONE DIPROPIONATE 40 MCG/ACT IN AERS: 1.0000 | INHALATION_SPRAY | Freq: Two times a day (BID) | RESPIRATORY_TRACT | Status: DC

## 2015-04-10 NOTE — Assessment & Plan Note (Signed)
Exacerbation resolved. Still poor control given 2-3 times daily albuterol use. Unable to get advair due to cost.  - QVAR low-dose rx'ed. Called pharmacy for cost and awaiting callback. Will notify patient once I hear. - Continue PRN albuterol. Discussed that optimal control is <=2x/week use. - Continue flonase for allergy control.

## 2015-04-10 NOTE — Progress Notes (Signed)
Subjective:   CC: Asthma follow-up  HPI:   Seen 03/21/2015 for asthma exacerbation triggered by allergic rhinitis. 5 day prednisone course, refilled albuterol, started Advair, started Flonase. Patient states that after the course of prednisone, she has been feeling much better with no cough and much decreased shortness of breath. Flonase daily seems to be helping with allergies. She was unable to afford Advair as it was not covered by insurance so she has not picked this up. She denies any tobacco use or smoke exposure.She has never been on controller medication.  Review of Systems - Per HPI.   PMH - short cervix, Rh- during pregnancy, rash, intermenstrual bleeding, asthma Smoking status: Nonsmoker    Objective:  Physical Exam BP 116/82 mmHg  Pulse 91  Temp(Src) 98.6 F (37 C) (Oral)  Ht 5\' 3"  (1.6 m)  Wt 223 lb (101.152 kg)  BMI 39.51 kg/m2  SpO2 96%  LMP 03/14/2015 GEN: NAD Cardiovascular: Regular rate and rhythm, no murmurs rubs or gallops, 2+ bilateral radial pulses Pulmonary: Clear to auscultation bilaterally, normal effort, no wheezes or crackles    Assessment:     Erica [name] is a 29 y.o. female here for follow-up of asthma exacerbation.   Plan:     # See problem list and after visit summary for problem-specific plans.   Follow-up: Follow up in 3-4 weeks for f/u of asthma control with controller inhaler.   Leona SingletonMaria T Caiden Arteaga, MD [facility]

## 2015-04-11 ENCOUNTER — Telehealth: Payer: Self-pay | Admitting: Family Medicine

## 2015-04-11 NOTE — Telephone Encounter (Signed)
Called patient to let her know that QVAR copay was going to be $50 which still seems high as it is something she will need to get monthly likely. Asked her to please call her insurance to ask which medication in same Inhaled Corticosteroid Class as QVAR would be preferred and therefore with a lower copay. She will ask and notify us once she finds out, so we can send that in, with the goal of improved asthma control.  Erica SingletonMaria T Danuel Felicetti, MD

## 2015-04-26 ENCOUNTER — Encounter: Payer: Self-pay | Admitting: Family Medicine

## 2015-05-09 ENCOUNTER — Other Ambulatory Visit (HOSPITAL_COMMUNITY)
Admission: RE | Admit: 2015-05-09 | Discharge: 2015-05-09 | Disposition: A | Discharge status: Home / Self Care | Payer: BLUE CROSS/BLUE SHIELD | Source: Ambulatory Visit | Attending: Family Medicine | Admitting: Family Medicine

## 2015-05-09 ENCOUNTER — Telehealth: Payer: Self-pay | Admitting: Family Medicine

## 2015-05-09 ENCOUNTER — Ambulatory Visit (INDEPENDENT_AMBULATORY_CARE_PROVIDER_SITE_OTHER): Payer: BLUE CROSS/BLUE SHIELD | Admitting: Family Medicine

## 2015-05-09 ENCOUNTER — Encounter: Payer: Self-pay | Admitting: Family Medicine

## 2015-05-09 VITALS — BP 141/96 | HR 71 | Temp 98.2°F | Ht 63.0 in | Wt 225.0 lb

## 2015-05-09 DIAGNOSIS — B9689 Other specified bacterial agents as the cause of diseases classified elsewhere: Secondary | ICD-10-CM

## 2015-05-09 DIAGNOSIS — Z113 Encounter for screening for infections with a predominantly sexual mode of transmission: Secondary | ICD-10-CM | POA: Insufficient documentation

## 2015-05-09 DIAGNOSIS — N898 Other specified noninflammatory disorders of vagina: Secondary | ICD-10-CM | POA: Diagnosis not present

## 2015-05-09 DIAGNOSIS — N760 Acute vaginitis: Principal | ICD-10-CM

## 2015-05-09 LAB — POCT WET PREP (WET MOUNT)
Clue Cells Wet Prep Whiff POC: POSITIVE
WBC, Wet Prep HPF POC: >20

## 2015-05-09 MED ORDER — METRONIDAZOLE 500 MG PO TABS: 500.0000 mg | ORAL_TABLET | Freq: Two times a day (BID) | ORAL | Status: AC

## 2015-05-09 NOTE — Telephone Encounter (Signed)
Left message on patient's voicemail to call clinic when she has a chance. When she calls, please let her know that wet prep was positive for bacterial vaginosis as we suspected. I have sent in metronidazole seven-day course like she has taken before. She should avoid alcohol while taking this. If she has further symptoms that are not resolving with treatment, she should return for reevaluation.  Erica SingletonMaria T Sajad Glander, MD

## 2015-05-09 NOTE — Progress Notes (Signed)
Patient ID: Erica [name], female   DOB: 05/27/1986, 29 y.o.   MRN: [id number] Subjective:   CC: Vaginal discharge  HPI:   Patient presents for same day visit to evaluate vaginal discharge that has been present for 3 days, is malodorous, and is causing mild itching. She denies recent antibiotic use, vaginal or abdominal pain, concern for STD, rash or blistering, fevers, chills, or pelvic pain. She has a history of BV and feels that is what this is. Discharge began as a reddish color with spotting and has turned into a brownish yellow. She recently removed bikini line hair using Darene Lamerair and thinks this may have irritated her vagina. She does not douche.  Review of Systems - Per HPI.   PMH: Intermenstrual bleeding, asthma moderate persistent, rash    Objective:  Physical Exam BP 141/96 mmHg  Pulse 71  Temp(Src) 98.2 F (36.8 C) (Oral)  Ht 5\' 3"  (1.6 m)  Wt 225 lb (102.059 kg)  BMI 39.87 kg/m2  LMP 04/16/2015 (Approximate) GEN: NAD GU: Normal external genitalia, vagina normal in appearance, cervix friable, moderate thick whitish clear discharge, no cervical motion tenderness, no adnexal tenderness Abdomen: Soft, nontender, nondistended    Assessment:     Erica MoleMargaret Ewing Wesenberg is a 29 y.o. female here for evaluation of vaginal discharge.    Plan:     # See problem list and after visit summary for problem-specific plans. -Urged patient to follow-up with Dr. Birdie SonsSonnenberg in 3-4 weeks to follow-up on asthma control with Qvar and continue looking for a cheaper alternative through discussions with her insurance.  # Health Maintenance: Blood pressure mildly elevated today. Recheck at follow-up  Follow-up: Follow up in 3 weeks for eval of asthma control with PCP. Follow-up when necessary if vaginal symptoms do not improve.   Leona SingletonMaria T Maritza Goldsborough, MD [facility]

## 2015-05-09 NOTE — Assessment & Plan Note (Signed)
3 days of malodorous vaginal discharge with mild itching. No abdominal pain, fevers, chills, or concern for STD. Likely BV though itching raises concern for yeast vaginitis. -Wet prep, GC / chlamydia -Will call patient with results and treatment plan

## 2015-05-09 NOTE — Patient Instructions (Signed)
We are getting lab work today and I will call you with the results. If you do not hear from us in 1-2 days, give us a call. This is most likely BV but I will hold off on sending in medication until we get the results later today. Make an appointment with Dr Kathie RhodesS in 3 weeks to follow up asthma or sooner if needed.  Best,  Leona SingletonMaria T Donnie Panik, MD

## 2015-05-10 LAB — CERVICOVAGINAL ANCILLARY ONLY
Chlamydia: NEGATIVE
Neisseria Gonorrhea: NEGATIVE

## 2015-05-12 NOTE — Telephone Encounter (Signed)
Called again. This time, got patient and repeated message. She states she had already picked up metronidazole and was taking it. Also let her know GC/Chlamydia were negative. She voiced understanding.  Erica SingletonMaria T Dannika Hilgeman, MD

## 2015-08-08 ENCOUNTER — Other Ambulatory Visit: Payer: Self-pay | Admitting: Family Medicine

## 2015-08-08 ENCOUNTER — Other Ambulatory Visit: Payer: Self-pay | Admitting: *Deleted

## 2015-08-08 MED ORDER — NORGESTIM-ETH ESTRAD TRIPHASIC 0.18/0.215/0.25 MG-35 MCG PO TABS: ORAL_TABLET | ORAL | Status: DC

## 2015-10-16 ENCOUNTER — Ambulatory Visit (INDEPENDENT_AMBULATORY_CARE_PROVIDER_SITE_OTHER): Payer: BLUE CROSS/BLUE SHIELD | Admitting: Family Medicine

## 2015-10-16 VITALS — BP 122/82 | HR 81 | Temp 98.0°F | Wt 221.7 lb

## 2015-10-16 DIAGNOSIS — L301 Dyshidrosis [pompholyx]: Secondary | ICD-10-CM

## 2015-10-16 DIAGNOSIS — A499 Bacterial infection, unspecified: Secondary | ICD-10-CM

## 2015-10-16 DIAGNOSIS — B9689 Other specified bacterial agents as the cause of diseases classified elsewhere: Secondary | ICD-10-CM

## 2015-10-16 DIAGNOSIS — N760 Acute vaginitis: Secondary | ICD-10-CM | POA: Diagnosis not present

## 2015-10-16 DIAGNOSIS — N898 Other specified noninflammatory disorders of vagina: Secondary | ICD-10-CM

## 2015-10-16 LAB — POCT WET PREP (WET MOUNT): Clue Cells Wet Prep Whiff POC: POSITIVE

## 2015-10-16 MED ORDER — METRONIDAZOLE 500 MG PO TABS: 500.0000 mg | ORAL_TABLET | Freq: Two times a day (BID) | ORAL | Status: DC

## 2015-10-16 MED ORDER — BETAMETHASONE DIPROPIONATE 0.05 % EX CREA: TOPICAL_CREAM | Freq: Two times a day (BID) | CUTANEOUS | Status: DC

## 2015-10-16 NOTE — Progress Notes (Signed)
    Subjective: CC: rash HPI: Patient is a 29 y.o. female presenting to clinic today for same day. Concerns today include:  1. Rash Patient reports that rash began about 1 week ago that started on her hands.  She notes that it has now spread to flanks and backside.  Rash is itchy.  Has used hydrocortisone cr OTC, helps but does not last long enough.  Rash on hands sometime leak fluid. No sick contacts that she knows of.  No one else has this rash.  Denies fevers, chills, cough, congestion, runny nose, vomiting.  No new shampoos, lotions, soaps, detergents, animals.    2. Vaginal odor Patient notes that she developed vaginal odor and discharge on Friday.  She notes that it started about 2 days after her period.  She has had this before.  Patient has been cutting back on carbs to attempt to decrease her occurences of BV.  Patient's last menstrual period was 10/07/2015 (exact date).  Patient is sexually active with her husband.  No concerns for STI.  No dysuria, hematuria.  Social History Reviewed: non smoker. FamHx and MedHx updated.  Please see EMR. Health Maintenance: flu shot UTD  ROS: Per HPI  Objective: Office vital signs reviewed. BP 122/82 mmHg  Pulse 81  Temp(Src) 98 F (36.7 C) (Oral)  Wt 221 lb 11.2 oz (100.562 kg)  LMP 10/07/2015 (Exact Date)  Physical Examination:  General: Awake, alert, obese, NAD HEENT: Normal, MMM, o/p without mucosal lesions GU: external vaginal tissue normal, cervix easily visualized, no punctate lesions, mild bleeding from os, moderate foul smelling cream colored discharge from os, no adnexal masses. Extremities: WWP, No edema, cyanosis or clubbing; +2 radial pulses bilaterally MSK: Normal gait and station Skin: dry, several small vesicular lesions along inner aspects of fingers, some appear to be healing, negative Nikolsky sign    Results for orders placed or performed in visit on 10/16/15 (from the past 24 hour(s))  POCT Wet Prep [name](Wet [place])      Status: Abnormal   Collection Time: 10/16/15  9:31 AM  Result Value Ref Range   Source Wet Prep POC vaginal    WBC, Wet Prep HPF POC 10-20    Bacteria Wet Prep HPF POC Moderate (A) None, Few, Too numerous to count   Clue Cells Wet Prep HPF POC Many (A) None, Too numerous to count   Clue Cells Wet Prep Whiff POC Positive Whiff    Yeast Wet Prep HPF POC None    Trichomonas Wet Prep HPF POC none     Assessment/ Plan: 29 y.o. female with  1. Dyshidrotic eczema.  No preceding viral illness, HPI seems very consistent with dyshidrotic eczema.  Could consider an autoimmune d/o as well. - Benadryl PRN itching at night - betamethasone dipropionate (DIPROLENE) 0.05 % cream; Apply topically 2 (two) times daily.  Dispense: 45 g; Refill: 1 x2-4 weeks, avoid face - Return precautions reviewed - Follow up PRN w/ PCP  2. Vaginal discharge Wet prep suggestive if BV - POCT Wet Prep (Wet [place]) - Flagyl 500mg  BID x7 days - Return precautions reviewed - Follow up with PCP  Raliegh IpAshly M Norinne Jeane, DO PGY-2, [facility]

## 2015-10-16 NOTE — Patient Instructions (Signed)
I think you have something called Dyshidrotic eczema.  I have sent in a prescription for a cream for this.  You should use it twice daily on affected areas (avoiding the face) for the next 2-4 weeks.  If you develop fevers, worsening rash or rash becomes painful, please seek immediate medical attention.  Otherwise plan to follow up with your PCP as needed.  I will contact you with the results of your microscopy.

## 2016-02-08 ENCOUNTER — Telehealth: Payer: Self-pay | Admitting: Family Medicine

## 2016-02-08 NOTE — Telephone Encounter (Signed)
Did get flu shot at [facility] somewhere in the fall

## 2016-02-13 ENCOUNTER — Other Ambulatory Visit: Payer: Self-pay | Admitting: Family Medicine

## 2016-03-17 ENCOUNTER — Other Ambulatory Visit: Payer: Self-pay | Admitting: Family Medicine

## 2016-03-17 DIAGNOSIS — J4521 Mild intermittent asthma with (acute) exacerbation: Secondary | ICD-10-CM

## 2016-03-17 MED ORDER — ALBUTEROL SULFATE HFA 108 (90 BASE) MCG/ACT IN AERS
INHALATION_SPRAY | RESPIRATORY_TRACT | Status: DC
Start: 2016-03-17 — End: 2016-08-06

## 2016-03-17 NOTE — Telephone Encounter (Signed)
Need refill on albuterol inhaler.  Send to [facility].

## 2016-05-14 ENCOUNTER — Telehealth: Payer: Self-pay | Admitting: Family Medicine

## 2016-05-14 NOTE — Telephone Encounter (Signed)
Will forward to MD and triage nurse to advise. Shaakira Borrero,CMA

## 2016-05-14 NOTE — Telephone Encounter (Signed)
Had her cycle 2 days late.  Should she still take her b/c pills as normal.  Suppose to start the new pack, but still on her cycle.

## 2016-05-14 NOTE — Telephone Encounter (Signed)
Sounds good. Thanks  CGM MD

## 2016-05-14 NOTE — Telephone Encounter (Signed)
Return call to patient regarding her menstrual cycle.  Patient stated her cycle was two days late. She has not missed or taking any pills late.  Advised patient to keep taking her pills as prescribed.  If she is worried she is pregnant to take a pregnancy test.  However, if negative to continue taking her birth control pills.  If positive, stop the pills and schedule an appointment with her provider.  Patient denies any other symptoms.  Will forward to PCP for further advise.  Clovis PuMartin, Emillee Talsma L, RN

## 2016-07-09 ENCOUNTER — Other Ambulatory Visit: Payer: Self-pay | Admitting: Family Medicine

## 2016-07-09 MED ORDER — NORGESTIM-ETH ESTRAD TRIPHASIC 0.18/0.215/0.25 MG-35 MCG PO TABS: ORAL_TABLET | ORAL | Status: DC

## 2016-07-09 NOTE — Telephone Encounter (Signed)
Pt would like a refill on her birth control. Please call her when rx is sent in. ep

## 2016-07-10 NOTE — Telephone Encounter (Signed)
She is welcome to make an annual wellness appt with me.

## 2016-07-10 NOTE — Telephone Encounter (Signed)
Spoke with patient and she is aware that medication has been called in.  She wants to know if she needs an appt to follow up with you. Ellaina Schuler,CMA

## 2016-07-10 NOTE — Telephone Encounter (Signed)
LM for patient to call back and schedule a physical. Ruthellen Tippy,CMA

## 2016-07-23 ENCOUNTER — Encounter: Payer: Self-pay | Admitting: Family Medicine

## 2016-07-23 ENCOUNTER — Ambulatory Visit (INDEPENDENT_AMBULATORY_CARE_PROVIDER_SITE_OTHER): Payer: BLUE CROSS/BLUE SHIELD | Admitting: Family Medicine

## 2016-07-23 VITALS — BP 110/70 | HR 74 | Temp 98.8°F | Ht 63.0 in | Wt 222.0 lb

## 2016-07-23 DIAGNOSIS — Z0000 Encounter for general adult medical examination without abnormal findings: Secondary | ICD-10-CM

## 2016-07-23 NOTE — Assessment & Plan Note (Signed)
Last pap on 01/08/2015 normal but since cervix at that time looked abnormal, will repeat pap in 2018. Not due at this time.  Counseled on diet, calcium, vit D. To discuss weight loss at next visit.

## 2016-07-23 NOTE — Progress Notes (Signed)
   Subjective:    Patient ID: Erica [name] , female   DOB: [birth year] , 30 y.o..   MRN: [id number]  HPI  Erica [name] is here for CPE.   Yearly Wellness Exam - States doing well.  - Asthma: had some recent concerns because was using inhaler more than usual 3 months ago, thought was because of allergies. Lately has only been using it 1-2x/week.  - Health maintenance is up to date, last 01/08/2015 normal but per notes had friable cervix at that time. Has had no vaginal discharge or abnormal vaginal bleeding. - Needs no refills on OCPs.  -  Wants to discuss weight loss at next visit. Thinks can do better with calcium intake.    Social Hx:  reports that she has never smoked. She has never used smokeless tobacco.    Objective:   BP 110/70 (BP Location: Left Arm, Patient Position: Sitting, Cuff Size: Large)   Pulse 74   Temp 98.8 F (37.1 C) (Oral)   Ht 5\' 3"  (1.6 m)   Wt 222 lb (100.7 kg)   LMP 07/04/2016   SpO2 99%   BMI 39.33 kg/m  Physical Exam  Gen: NAD, alert, cooperative with exam, well-appearing HEENT: NCAT, EOMI Cardiac: Regular rate and rhythm, normal S1/S2, no murmur, no edema, capillary refill brisk  Respiratory: Clear to auscultation bilaterally, no wheezes, non-labored Gastrointestinal: soft, non tender, non distended, bowel sounds present Skin: no rashes, normal turgor  Neurological: no gross deficits.  Psych: good insight, normal mood and affect   Assessment & Plan:  Well adult on routine health check Last pap on 01/08/2015 normal but since cervix at that time looked abnormal, will repeat pap in 2018. Not due at this time.  Counseled on diet, calcium, vit D. To discuss weight loss at next visit.   Follow up: Patient to schedule follow up to discuss weight loss.

## 2016-07-23 NOTE — Patient Instructions (Addendum)
Thank you for coming in to be seen today. Today we talked about your well woman exam and your next pap should be done in 2018 because although your last pap was normal, your cervix had some changes in January 2016.  We also discussed good calcium intake and vitamin D3 over the counter supplementation 1000U daily.  I look forward to your next visit, please schedule, to talk about weight loss.    Health Maintenance, Female Adopting a healthy lifestyle and getting preventive care can go a long way to promote health and wellness. Talk with your health care provider about what schedule of regular examinations is right for you. This is a good chance for you to check in with your provider about disease prevention and staying healthy. In between checkups, there are plenty of things you can do on your own. Experts have done a lot of research about which lifestyle changes and preventive measures are most likely to keep you healthy. Ask your health care provider for more information. WEIGHT AND DIET  Eat a healthy diet  Be sure to include plenty of vegetables, fruits, low-fat dairy products, and lean protein.  Do not eat a lot of foods high in solid fats, added sugars, or salt.  Get regular exercise. This is one of the most important things you can do for your health.  Most adults should exercise for at least 150 minutes each week. The exercise should increase your heart rate and make you sweat (moderate-intensity exercise).  Most adults should also do strengthening exercises at least twice a week. This is in addition to the moderate-intensity exercise.  Maintain a healthy weight  Body mass index (BMI) is a measurement that can be used to identify possible weight problems. It estimates body fat based on height and weight. Your health care provider can help determine your BMI and help you achieve or maintain a healthy weight.  For females 59 years of age and older:   A BMI below 18.5 is considered  underweight.  A BMI of 18.5 to 24.9 is normal.  A BMI of 25 to 29.9 is considered overweight.  A BMI of 30 and above is considered obese.  Watch levels of cholesterol and blood lipids  You should start having your blood tested for lipids and cholesterol at 30 years of age, then have this test every 5 years.  You may need to have your cholesterol levels checked more often if:  Your lipid or cholesterol levels are high.  You are older than 30 years of age.  You are at high risk for heart disease.  CANCER SCREENING   Lung Cancer  Lung cancer screening is recommended for adults 1-54 years old who are at high risk for lung cancer because of a history of smoking.  A yearly low-dose CT scan of the lungs is recommended for people who:  Currently smoke.  Have quit within the past 15 years.  Have at least a 30-pack-year history of smoking. A pack year is smoking an average of one pack of cigarettes a day for 1 year.  Yearly screening should continue until it has been 15 years since you quit.  Yearly screening should stop if you develop a health problem that would prevent you from having lung cancer treatment.  Breast Cancer  Practice breast self-awareness. This means understanding how your breasts normally appear and feel.  It also means doing regular breast self-exams. Let your health care provider know about any changes, no matter how small.  If you are in your 20s or 30s, you should have a clinical breast exam (CBE) by a health care provider every 1-3 years as part of a regular health exam.  If you are 68 or older, have a CBE every year. Also consider having a breast X-ray (mammogram) every year.  If you have a family history of breast cancer, talk to your health care provider about genetic screening.  If you are at high risk for breast cancer, talk to your health care provider about having an MRI and a mammogram every year.  Breast cancer gene (BRCA) assessment is  recommended for women who have family members with BRCA-related cancers. BRCA-related cancers include:  Breast.  Ovarian.  Tubal.  Peritoneal cancers.  Results of the assessment will determine the need for genetic counseling and BRCA1 and BRCA2 testing. Cervical Cancer Your health care provider may recommend that you be screened regularly for cancer of the pelvic organs (ovaries, uterus, and vagina). This screening involves a pelvic examination, including checking for microscopic changes to the surface of your cervix (Pap test). You may be encouraged to have this screening done every 3 years, beginning at age 37.  For women ages 47-65, health care providers may recommend pelvic exams and Pap testing every 3 years, or they may recommend the Pap and pelvic exam, combined with testing for human papilloma virus (HPV), every 5 years. Some types of HPV increase your risk of cervical cancer. Testing for HPV may also be done on women of any age with unclear Pap test results.  Other health care providers may not recommend any screening for nonpregnant women who are considered low risk for pelvic cancer and who do not have symptoms. Ask your health care provider if a screening pelvic exam is right for you.  If you have had past treatment for cervical cancer or a condition that could lead to cancer, you need Pap tests and screening for cancer for at least 20 years after your treatment. If Pap tests have been discontinued, your risk factors (such as having a new sexual partner) need to be reassessed to determine if screening should resume. Some women have medical problems that increase the chance of getting cervical cancer. In these cases, your health care provider may recommend more frequent screening and Pap tests. Colorectal Cancer  This type of cancer can be detected and often prevented.  Routine colorectal cancer screening usually begins at 30 years of age and continues through 30 years of  age.  Your health care provider may recommend screening at an earlier age if you have risk factors for colon cancer.  Your health care provider may also recommend using home test kits to check for hidden blood in the stool.  A small camera at the end of a tube can be used to examine your colon directly (sigmoidoscopy or colonoscopy). This is done to check for the earliest forms of colorectal cancer.  Routine screening usually begins at age 24.  Direct examination of the colon should be repeated every 5-10 years through 30 years of age. However, you may need to be screened more often if early forms of precancerous polyps or small growths are found. Skin Cancer  Check your skin from head to toe regularly.  Tell your health care provider about any new moles or changes in moles, especially if there is a change in a mole's shape or color.  Also tell your health care provider if you have a mole that is larger than the  size of a pencil eraser.  Always use sunscreen. Apply sunscreen liberally and repeatedly throughout the day.  Protect yourself by wearing long sleeves, pants, a wide-brimmed hat, and sunglasses whenever you are outside. HEART DISEASE, DIABETES, AND HIGH BLOOD PRESSURE   High blood pressure causes heart disease and increases the risk of stroke. High blood pressure is more likely to develop in:  People who have blood pressure in the high end of the normal range (130-139/85-89 mm Hg).  People who are overweight or obese.  People who are African American.  If you are 35-24 years of age, have your blood pressure checked every 3-5 years. If you are 87 years of age or older, have your blood pressure checked every year. You should have your blood pressure measured twice--once when you are at a hospital or clinic, and once when you are not at a hospital or clinic. Record the average of the two measurements. To check your blood pressure when you are not at a hospital or clinic, you can  use:  An automated blood pressure machine at a pharmacy.  A home blood pressure monitor.  If you are between 66 years and 60 years old, ask your health care provider if you should take aspirin to prevent strokes.  Have regular diabetes screenings. This involves taking a blood sample to check your fasting blood sugar level.  If you are at a normal weight and have a low risk for diabetes, have this test once every three years after 30 years of age.  If you are overweight and have a high risk for diabetes, consider being tested at a younger age or more often. PREVENTING INFECTION  Hepatitis B  If you have a higher risk for hepatitis B, you should be screened for this virus. You are considered at high risk for hepatitis B if:  You were born in a country where hepatitis B is common. Ask your health care provider which countries are considered high risk.  Your parents were born in a high-risk country, and you have not been immunized against hepatitis B (hepatitis B vaccine).  You have HIV or AIDS.  You use needles to inject street drugs.  You live with someone who has hepatitis B.  You have had sex with someone who has hepatitis B.  You get hemodialysis treatment.  You take certain medicines for conditions, including cancer, organ transplantation, and autoimmune conditions. Hepatitis C  Blood testing is recommended for:  Everyone born from [birth year] through 1965.  Anyone with known risk factors for hepatitis C. Sexually transmitted infections (STIs)  You should be screened for sexually transmitted infections (STIs) including gonorrhea and chlamydia if:  You are sexually active and are younger than 30 years of age.  You are older than 30 years of age and your health care provider tells you that you are at risk for this type of infection.  Your sexual activity has changed since you were last screened and you are at an increased risk for chlamydia or gonorrhea. Ask your health care  provider if you are at risk.  If you do not have HIV, but are at risk, it may be recommended that you take a prescription medicine daily to prevent HIV infection. This is called pre-exposure prophylaxis (PrEP). You are considered at risk if:  You are sexually active and do not regularly use condoms or know the HIV status of your partner(s).  You take drugs by injection.  You are sexually active with a partner who  has HIV. Talk with your health care provider about whether you are at high risk of being infected with HIV. If you choose to begin PrEP, you should first be tested for HIV. You should then be tested every 3 months for as long as you are taking PrEP.  PREGNANCY   If you are premenopausal and you may become pregnant, ask your health care provider about preconception counseling.  If you may become pregnant, take 400 to 800 micrograms (mcg) of folic acid every day.  If you want to prevent pregnancy, talk to your health care provider about birth control (contraception). OSTEOPOROSIS AND MENOPAUSE   Osteoporosis is a disease in which the bones lose minerals and strength with aging. This can result in serious bone fractures. Your risk for osteoporosis can be identified using a bone density scan.  If you are 52 years of age or older, or if you are at risk for osteoporosis and fractures, ask your health care provider if you should be screened.  Ask your health care provider whether you should take a calcium or vitamin D supplement to lower your risk for osteoporosis.  Menopause may have certain physical symptoms and risks.  Hormone replacement therapy may reduce some of these symptoms and risks. Talk to your health care provider about whether hormone replacement therapy is right for you.  HOME CARE INSTRUCTIONS   Schedule regular health, dental, and eye exams.  Stay current with your immunizations.   Do not use any tobacco products including cigarettes, chewing tobacco, or  electronic cigarettes.  If you are pregnant, do not drink alcohol.  If you are breastfeeding, limit how much and how often you drink alcohol.  Limit alcohol intake to no more than 1 drink per day for nonpregnant women. One drink equals 12 ounces of beer, 5 ounces of wine, or 1 ounces of hard liquor.  Do not use street drugs.  Do not share needles.  Ask your health care provider for help if you need support or information about quitting drugs.  Tell your health care provider if you often feel depressed.  Tell your health care provider if you have ever been abused or do not feel safe at home.   This information is not intended to replace advice given to you by your health care provider. Make sure you discuss any questions you have with your health care provider.   Document Released: 06/23/2011 Document Revised: 12/29/2014 Document Reviewed: 11/09/2013 Elsevier Interactive Patient Education [employer].

## 2016-07-28 ENCOUNTER — Telehealth: Payer: Self-pay | Admitting: Family Medicine

## 2016-07-28 NOTE — Telephone Encounter (Signed)
Pt forgot to tell the doctor last week that she is still spotting in between periods. Please advise. Thanks! ep

## 2016-07-29 NOTE — Telephone Encounter (Signed)
Would like to discuss at next visit, can patient make appt as planned for weight loss discussion and we can talk about spotting then too.

## 2016-07-29 NOTE — Telephone Encounter (Signed)
Scheduled apt for end of August with Dr. Artist PaisYoo.

## 2016-07-30 ENCOUNTER — Ambulatory Visit (INDEPENDENT_AMBULATORY_CARE_PROVIDER_SITE_OTHER): Payer: BLUE CROSS/BLUE SHIELD | Admitting: Family Medicine

## 2016-07-30 ENCOUNTER — Other Ambulatory Visit (HOSPITAL_COMMUNITY)
Admission: RE | Admit: 2016-07-30 | Discharge: 2016-07-30 | Disposition: A | Discharge status: Home / Self Care | Payer: BLUE CROSS/BLUE SHIELD | Source: Ambulatory Visit | Attending: Family Medicine | Admitting: Family Medicine

## 2016-07-30 ENCOUNTER — Encounter: Payer: Self-pay | Admitting: Family Medicine

## 2016-07-30 VITALS — BP 143/100 | HR 67 | Temp 98.1°F | Wt 223.0 lb

## 2016-07-30 DIAGNOSIS — N898 Other specified noninflammatory disorders of vagina: Secondary | ICD-10-CM | POA: Diagnosis not present

## 2016-07-30 DIAGNOSIS — Z113 Encounter for screening for infections with a predominantly sexual mode of transmission: Secondary | ICD-10-CM | POA: Diagnosis present

## 2016-07-30 LAB — POCT WET PREP (WET MOUNT)
CLUE CELLS WET PREP WHIFF POC: POSITIVE
TRICHOMONAS WET PREP HPF POC: ABSENT
WBC, Wet Prep HPF POC: >20

## 2016-07-30 LAB — POCT URINE PREGNANCY: PREG TEST UR: NEGATIVE

## 2016-07-30 MED ORDER — METRONIDAZOLE 500 MG PO TABS: 500.0000 mg | ORAL_TABLET | Freq: Two times a day (BID) | ORAL | 0 refills | Status: DC

## 2016-07-30 NOTE — Patient Instructions (Signed)
It was a pleasure seeing you today in our clinic. Today we discussed your vaginal discharge. Here is the treatment plan we have discussed and agreed upon together:   - I prescribed metronidazole. Take this twice a day until the prescription is complete.  - You may want to start eating yogurt twice a day while on this antibiotic, and 3 days after your antibiotic course is complete to help prevent yeast infections. - I will mail you the results of the other tests performed today.

## 2016-07-30 NOTE — Assessment & Plan Note (Addendum)
Patient here for vaginal discharge. Pelvic exam and wet prep obtained. Results most consistent with BV. Urine pregnancy negative - Metronidazole twice a day 7 days - Encouraged increase yogurt intake while on medication to help prevent yeast infection. - Labs pending: RPR, HIV, GC/chlamydia

## 2016-07-30 NOTE — Progress Notes (Signed)
VAGINAL DISCHARGE Believes she has BV. X3 days. Malodorous. Itching. Discharge -- thick, yellow  Having vaginal discharge for 3 days. Discharge is: thick, yellow, "not a pleasant smell" Sex in last month: yes Using barrier protection (condoms): no; OCP for contra Possible STD exposure: not unless infidelity by husband Personal history of vaginal infection: BV and trich in past (says similar to BV) Recent antibiotic use: no  Symptoms Vaginal itching: yes Dysuria: no Dyspareunia: no Genital sores or ulcers:no Hematuria: no Flank pain: no Weight loss: no Weight gain: no Trouble with vision: no Headaches: no Abdomen or pelvic pain: no Back pain: no   ROS see HPI Smoking Status noted   CC, SH/smoking status, and VS noted  Objective: BP (!) 143/100   Pulse 67   Temp 98.1 F (36.7 C) (Oral)   Wt 223 lb (101.2 kg)   LMP 07/04/2016   BMI 39.50 kg/m  Gen: NAD, alert, cooperative, and pleasant. CV: Well-perfused. Resp: Non-labored. Abd: SNTND Female genitalia: normal external genitalia, vulva, vagina, cervix, uterus and adnexa Cervix: cervical discharge present - copious, foul, green, malodorous and mucoid and cervical motion tenderness absent   Assessment and plan:  Vaginal discharge Patient here for vaginal discharge. Pelvic exam and wet prep obtained. Results most consistent with BV. Urine pregnancy negative - Metronidazole twice a day 7 days - Encouraged increase yogurt intake while on medication to help prevent yeast infection. - Labs pending: RPR, HIV, GC/chlamydia   Orders Placed This Encounter  Procedures  . POCT urine pregnancy  . POCT Wet Prep [facility] [place]    Meds ordered this encounter  Medications  . metroNIDAZOLE (FLAGYL) 500 MG tablet    Sig: Take 1 tablet (500 mg total) by mouth 2 (two) times daily.    Dispense:  14 tablet    Refill:  0     Kathee DeltonIan D McKeag, MD,MS,  PGY3 07/30/2016 12:29 PM

## 2016-07-31 LAB — CERVICOVAGINAL ANCILLARY ONLY
Chlamydia: NEGATIVE
Neisseria Gonorrhea: NEGATIVE

## 2016-08-06 ENCOUNTER — Other Ambulatory Visit: Payer: Self-pay | Admitting: *Deleted

## 2016-08-06 MED ORDER — ALBUTEROL SULFATE HFA 108 (90 BASE) MCG/ACT IN AERS: INHALATION_SPRAY | RESPIRATORY_TRACT | 1 refills | Status: DC

## 2016-08-18 ENCOUNTER — Ambulatory Visit (INDEPENDENT_AMBULATORY_CARE_PROVIDER_SITE_OTHER): Payer: BLUE CROSS/BLUE SHIELD | Admitting: Family Medicine

## 2016-08-18 ENCOUNTER — Encounter: Payer: Self-pay | Admitting: Family Medicine

## 2016-08-18 VITALS — BP 139/86 | HR 83 | Temp 98.1°F | Ht 63.0 in | Wt 226.0 lb

## 2016-08-18 DIAGNOSIS — N939 Abnormal uterine and vaginal bleeding, unspecified: Secondary | ICD-10-CM | POA: Diagnosis not present

## 2016-08-18 DIAGNOSIS — E669 Obesity, unspecified: Secondary | ICD-10-CM

## 2016-08-18 DIAGNOSIS — N923 Ovulation bleeding: Secondary | ICD-10-CM

## 2016-08-18 MED ORDER — NORGESTIMATE-ETH ESTRADIOL 0.25-35 MG-MCG PO TABS: 1.0000 | ORAL_TABLET | Freq: Every day | ORAL | 11 refills | Status: DC

## 2016-08-18 NOTE — Progress Notes (Signed)
   Subjective:    Patient ID: Erica [name] , female   DOB: 11/10/1986 , 30 y.o..   MRN: [id number]  HPI  Erica [name] is here for spotting between periods and desire for weight loss.  Spotting between periods States usually has light spotting but this month had more spotting than usual, is very frustrated. Spotting this month started on 8/7 when had BV. Then had "period" on 8/14-8/18 while on white pills of OCP tri-sprintec. Then on 8/20 had light spotting, most apparent when she wiped on 8/20-8/23. Then had spotting after intercourse on 8/24. Discussed that spotting on 8/9 and 8/24 most likely from discernable causes but that spotting during the first week of active pills of Tri-sprintec may be the issue. Denies vaginal discharge, odor, cramps. Denies h/o clotting disorders, migraines, heart or liver issues.  Weight loss Patient states is very motivated to lose weight for herself and for her daughter who is also obese.  - States goal is to lose 50lbs by 31st birthday in February - Wants to incorporate daily exercise, discussed walking. - Discussed healthy diet, has attempted to cut down on soda/juice and drink more water. States sweets and breads are going to be difficult for her. Would like to speak with nutritionist.   Review of Systems: Per HPI.   Social Hx:  reports that she has never smoked. She has never used smokeless tobacco.    Objective:   BP 139/86   Pulse 83   Temp 98.1 F (36.7 C) (Oral)   Ht 5\' 3"  (1.6 m)   Wt 226 lb (102.5 kg)   LMP 08/04/2016 (Exact Date)   BMI 40.03 kg/m  Physical Exam  Gen: NAD, alert, cooperative with exam, well-appearing HEENT: NCAT, clear conjunctiva Cardiac: Regular rate and rhythm, normal S1/S2, no murmur, no edema Respiratory: No respiratory distress Gastrointestinal: soft, non tender, non distended, bowel sounds present Skin: no rashes, normal turgor  Neurological: no gross deficits.  Psych: good insight, normal mood  and affect   Assessment & Plan:  Intermenstrual bleeding Change from tri-sprintec to sprintec  Obesity Discussed achievable weight goals, daily exercise and healthy eating. Patient to make appt to see Dr. Gerilyn PilgrimSykes nutritionist   Follow up: as needed

## 2016-08-18 NOTE — Patient Instructions (Addendum)
Thank you for coming into be seen today.  For your spotting between periods, we are going to switch you from Tri-sprintec to Sprintec birth control. It may take some time for your body to adjust.  For your weight loss plan, I am happy to hear how motivated you are! Please call and make an appointment with our nutritionist Dr. Gerilyn Pilgrim.   Oral Contraception Information Oral contraceptive pills (OCPs) are medicines taken to prevent pregnancy. OCPs work by preventing the ovaries from releasing eggs. The hormones in OCPs also cause the cervical mucus to thicken, preventing the sperm from entering the uterus. The hormones also cause the uterine lining to become thin, not allowing a fertilized egg to attach to the inside of the uterus. OCPs are highly effective when taken exactly as prescribed. However, OCPs do not prevent sexually transmitted diseases (STDs). Safe sex practices, such as using condoms along with the pill, can help prevent STDs.  Before taking the pill, you may have a physical exam and Pap test. Your health care provider may order blood tests. The health care provider will make sure you are a good candidate for oral contraception. Discuss with your health care provider the possible side effects of the OCP you may be prescribed. When starting an OCP, it can take 2 to 3 months for the body to adjust to the changes in hormone levels in your body.  TYPES OF ORAL CONTRACEPTION  The combination pill--This pill contains estrogen and progestin (synthetic progesterone) hormones. The combination pill comes in 21-day, 28-day, or 91-day packs. Some types of combination pills are meant to be taken continuously (365-day pills). With 21-day packs, you do not take pills for 7 days after the last pill. With 28-day packs, the pill is taken every day. The last 7 pills are without hormones. Certain types of pills have more than 21 hormone-containing pills. With 91-day packs, the first 84 pills contain both hormones,  and the last 7 pills contain no hormones or contain estrogen only.  The minipill--This pill contains the progesterone hormone only. The pill is taken every day continuously. It is very important to take the pill at the same time each day. The minipill comes in packs of 28 pills. All 28 pills contain the hormone.  ADVANTAGES OF ORAL CONTRACEPTIVE PILLS  Decreases premenstrual symptoms.   Treats menstrual period cramps.   Regulates the menstrual cycle.   Decreases a heavy menstrual flow.   May treatacne, depending on the type of pill.   Treats abnormal uterine bleeding.   Treats polycystic ovarian syndrome.   Treats endometriosis.   Can be used as emergency contraception.  THINGS THAT CAN MAKE ORAL CONTRACEPTIVE PILLS LESS EFFECTIVE OCPs can be less effective if:   You forget to take the pill at the same time every day.   You have a stomach or intestinal disease that lessens the absorption of the pill.   You take OCPs with other medicines that make OCPs less effective, such as antibiotics, certain HIV medicines, and some seizure medicines.   You take expired OCPs.   You forget to restart the pill on day 7, when using the packs of 21 pills.  RISKS ASSOCIATED WITH ORAL CONTRACEPTIVE PILLS  Oral contraceptive pills can sometimes cause side effects, such as:  Headache.  Nausea.  Breast tenderness.  Irregular bleeding or spotting. Combination pills are also associated with a small increased risk of:  Blood clots.  Heart attack.  Stroke.   This information is not intended to  replace advice given to you by your health care provider. Make sure you discuss any questions you have with your health care provider.   Document Released: 02/28/2003 Document Revised: 09/28/2013 Document Reviewed: 05/29/2013 Elsevier Interactive Patient Education 2016 [employer]   Exercising to [employer] Exercising regularly is important. It has many health benefits, such  as:  Improving your overall fitness, flexibility, and endurance.  Increasing your bone density.  Helping with weight control.  Decreasing your body fat.  Increasing your muscle strength.  Reducing stress and tension.  Improving your overall health. In order to become healthy and stay healthy, it is recommended that you do moderate-intensity and vigorous-intensity exercise. You can tell that you are exercising at a moderate intensity if you have a higher heart rate and faster breathing, but you are still able to hold a conversation. You can tell that you are exercising at a vigorous intensity if you are breathing much harder and faster and cannot hold a conversation while exercising. HOW OFTEN SHOULD I EXERCISE? Choose an activity that you enjoy and set realistic goals. Your health care provider can help you to make an activity plan that works for you. Exercise regularly as directed by your health care provider. This may include:   Doing resistance training twice each week, such as:  Push-ups.  Sit-ups.  Lifting weights.  Using resistance bands.  Doing a given intensity of exercise for a given amount of time. Choose from these options:  150 minutes of moderate-intensity exercise every week.  75 minutes of vigorous-intensity exercise every week.  A mix of moderate-intensity and vigorous-intensity exercise every week. Children, pregnant women, people who are out of shape, people who are overweight, and older adults may need to consult a health care provider for individual recommendations. If you have any sort of medical condition, be sure to consult your health care provider before starting a new exercise program.  WHAT ARE SOME EXERCISE IDEAS? Some moderate-intensity exercise ideas include:   Walking at a rate of 1 mile in 15 minutes.  Biking.  Hiking.  Golfing.  Dancing. Some vigorous-intensity exercise ideas include:   Walking at a rate of at least 4.5 miles per  hour.  Jogging or running at a rate of 5 miles per hour.  Biking at a rate of at least 10 miles per hour.  Lap swimming.  Roller-skating or in-line skating.  Cross-country skiing.  Vigorous competitive sports, such as football, basketball, and soccer.  Jumping rope.  Aerobic dancing. WHAT ARE SOME EVERYDAY ACTIVITIES THAT CAN HELP ME TO GET EXERCISE?  Yard work, such as:  Child psychotherapistushing a lawn mower.  Raking and bagging leaves.  Washing and waxing your car.  Pushing a stroller.  Shoveling snow.  Gardening.  Washing windows or floors. HOW CAN I BE MORE ACTIVE IN MY DAY-TO-DAY ACTIVITIES?  Use the stairs instead of the elevator.  Take a walk during your lunch break.  If you drive, park your car farther away from work or school.  If you take public transportation, get off one stop early and walk the rest of the way.  Make all of your phone calls while standing up and walking around.  Get up, stretch, and walk around every 30 minutes throughout the day. WHAT GUIDELINES SHOULD I FOLLOW WHILE EXERCISING?  Do not exercise so much that you hurt yourself, feel dizzy, or get very short of breath.  Consult your health care provider before starting a new exercise program.  Wear comfortable clothes and  shoes with good support.  Drink plenty of water while you exercise to prevent dehydration or heat stroke. Body water is lost during exercise and must be replaced.  Work out until you breathe faster and your heart beats faster.   This information is not intended to replace advice given to you by your health care provider. Make sure you discuss any questions you have with your health care provider.   Document Released: 01/10/2011 Document Revised: 12/29/2014 Document Reviewed: 05/11/2014 Elsevier Interactive Patient Education [employer]

## 2016-08-18 NOTE — Assessment & Plan Note (Addendum)
Discussed achievable weight goals (2-3lbs/week), daily exercise (30min cardio daily) and healthy eating. Patient to make appt to see Dr. Gerilyn PilgrimSykes nutritionist

## 2016-08-18 NOTE — Assessment & Plan Note (Signed)
Change from tri-sprintec to sprintec

## 2016-11-10 ENCOUNTER — Other Ambulatory Visit (HOSPITAL_COMMUNITY)
Admission: RE | Admit: 2016-11-10 | Discharge: 2016-11-10 | Disposition: A | Discharge status: Home / Self Care | Payer: BLUE CROSS/BLUE SHIELD | Source: Ambulatory Visit | Attending: Family Medicine | Admitting: Family Medicine

## 2016-11-10 ENCOUNTER — Other Ambulatory Visit: Payer: Self-pay | Admitting: Family Medicine

## 2016-11-10 ENCOUNTER — Ambulatory Visit (INDEPENDENT_AMBULATORY_CARE_PROVIDER_SITE_OTHER): Payer: BLUE CROSS/BLUE SHIELD | Admitting: Student

## 2016-11-10 ENCOUNTER — Encounter: Payer: Self-pay | Admitting: Student

## 2016-11-10 VITALS — BP 140/94 | HR 70 | Temp 98.0°F | Wt 224.0 lb

## 2016-11-10 DIAGNOSIS — N898 Other specified noninflammatory disorders of vagina: Secondary | ICD-10-CM

## 2016-11-10 DIAGNOSIS — Z113 Encounter for screening for infections with a predominantly sexual mode of transmission: Secondary | ICD-10-CM | POA: Diagnosis present

## 2016-11-10 DIAGNOSIS — N760 Acute vaginitis: Secondary | ICD-10-CM | POA: Diagnosis not present

## 2016-11-10 DIAGNOSIS — B9689 Other specified bacterial agents as the cause of diseases classified elsewhere: Secondary | ICD-10-CM | POA: Diagnosis not present

## 2016-11-10 LAB — POCT WET PREP (WET MOUNT)
CLUE CELLS WET PREP WHIFF POC: POSITIVE
Trichomonas Wet Prep HPF POC: ABSENT

## 2016-11-10 MED ORDER — METRONIDAZOLE 500 MG PO TABS: 500.0000 mg | ORAL_TABLET | Freq: Two times a day (BID) | ORAL | 0 refills | Status: DC

## 2016-11-10 NOTE — Assessment & Plan Note (Signed)
Benign exam with no evidence of PID. Wet prep shows BV - will treat BV - GC/CT collected given h/o unprotected intercourse, will follow

## 2016-11-10 NOTE — Telephone Encounter (Signed)
Pt said Rx was sent to the wrong pharmacy. Pt is using [facility]. Please advise. Thanks! ep

## 2016-11-10 NOTE — Telephone Encounter (Signed)
[facility] per [facility]'s request.  Clovis PuMartin, Tamika L, RN

## 2016-11-10 NOTE — Progress Notes (Signed)
Subjective:    Patient ID: Erica [name], female    DOB: 1986-10-23, 30 y.o.   MRN: [id number]   CC: vaginal Discharge  HPI: 30 year old female presenting for vaginal discharge  Vaginal discharge - Started 4 days ago - Has foul odor - Last had sex 6 days ago, did not use a condom - Has one female sexual partner Denies dysuria, abdominal pain, fevers - Patient's last menstrual period was 10/22/2016. -  Smoking status reviewed  Review of Systems  Per HPI, else denies recent illness, chest pain, shortness of breath, abdominal pain, N/V/D,     Objective:  BP (!) 140/94   Pulse 70   Temp 98 F (36.7 C) (Oral)   Wt 224 lb (101.6 kg)   LMP 10/22/2016   SpO2 100%   BMI 39.68 kg/m  Vitals and nursing note reviewed  General: NAD Cardiac: RRR Respiratory: CTAB, normal effort Extremities: no edema or cyanosis Skin: warm and dry, no rashes noted Neuro: alert and oriented   Pelvic: Normal EGBUS, normal vaginal canal with moderate white discharge, normal cervix with no CMT, normal mobile uterus, normal adnexa with no masses, no adnexal tenderness    Assessment & Plan:    Vaginal discharge Benign exam with no evidence of PID. Wet prep shows BV - will treat BV - GC/CT collected given h/o unprotected intercourse, will follow    Tung Pustejovsky A. Kennon Rounds MD, MS Family Medicine Resident PGY-3 Pager [phone]

## 2016-11-11 LAB — CERVICOVAGINAL ANCILLARY ONLY
Chlamydia: NEGATIVE
Neisseria Gonorrhea: NEGATIVE

## 2017-07-27 ENCOUNTER — Encounter: Payer: Self-pay | Admitting: Family Medicine

## 2017-07-27 ENCOUNTER — Other Ambulatory Visit (HOSPITAL_COMMUNITY)
Admission: RE | Admit: 2017-07-27 | Discharge: 2017-07-27 | Disposition: A | Discharge status: Home / Self Care | Payer: BLUE CROSS/BLUE SHIELD | Source: Ambulatory Visit | Attending: Family Medicine | Admitting: Family Medicine

## 2017-07-27 ENCOUNTER — Ambulatory Visit (INDEPENDENT_AMBULATORY_CARE_PROVIDER_SITE_OTHER): Payer: BLUE CROSS/BLUE SHIELD | Admitting: Family Medicine

## 2017-07-27 VITALS — BP 128/96 | HR 82 | Temp 98.8°F | Ht 63.0 in | Wt 234.8 lb

## 2017-07-27 DIAGNOSIS — N72 Inflammatory disease of cervix uteri: Secondary | ICD-10-CM

## 2017-07-27 DIAGNOSIS — B9689 Other specified bacterial agents as the cause of diseases classified elsewhere: Secondary | ICD-10-CM | POA: Diagnosis not present

## 2017-07-27 DIAGNOSIS — N898 Other specified noninflammatory disorders of vagina: Secondary | ICD-10-CM | POA: Insufficient documentation

## 2017-07-27 DIAGNOSIS — N923 Ovulation bleeding: Secondary | ICD-10-CM

## 2017-07-27 DIAGNOSIS — N760 Acute vaginitis: Secondary | ICD-10-CM | POA: Diagnosis not present

## 2017-07-27 LAB — POCT UA - MICROSCOPIC ONLY

## 2017-07-27 LAB — POCT URINALYSIS DIP (MANUAL ENTRY)
BILIRUBIN UA: NEGATIVE mg/dL
Bilirubin, UA: NEGATIVE
Glucose, UA: NEGATIVE mg/dL
NITRITE UA: NEGATIVE
PH UA: 6 (ref 5.0–8.0)
PROTEIN UA: NEGATIVE mg/dL
Spec Grav, UA: 1.015 (ref 1.010–1.025)
Urobilinogen, UA: 0.2 E.U./dL

## 2017-07-27 LAB — POCT WET PREP (WET MOUNT)
Clue Cells Wet Prep Whiff POC: POSITIVE
TRICHOMONAS WET PREP HPF POC: ABSENT
WBC, Wet Prep HPF POC: >20

## 2017-07-27 LAB — POCT URINE PREGNANCY: Preg Test, Ur: NEGATIVE

## 2017-07-27 MED ORDER — METRONIDAZOLE 500 MG PO TABS: 500.0000 mg | ORAL_TABLET | Freq: Two times a day (BID) | ORAL | 0 refills | Status: AC

## 2017-07-27 NOTE — Patient Instructions (Addendum)
It was good to see you today  For your vaginal discharge,  - We checked some tests today. If results require attention, either myself or my nurse will get in touch with you. If everything is normal, you will get a letter in the mail or a message in My Chart. Please give us a call if you do not hear from us after 2 weeks  Please check-out at the front desk before leaving the clinic. Make an appointment in  1 month   Please bring all of your medications with you to each visit.   Sign up for My Chart to have easy access to your labs results, and communication with your primary care physician.  Feel free to call with any questions or concerns at any time, at [phone].   Take care,  Dr. Leland HerElsia J Yoo, DO [facility]

## 2017-07-27 NOTE — Progress Notes (Signed)
Subjective:  Erica [name] is a 31 y.o. female who [facility] today with a chief complaint of R flank pain and vaginal concerns.   HPI:  R sided pain - wakes up from sleep. Only at night - does not happen when lays on R sided. Resolves after she gets ups and goes to bathroom - did have previously years ago, thought because of soda. Went away - now back for 6 months, almost every night that she does not sleep on R side - throbbing sensation, lasts ~115min - worse with deep breathing - No urinary symptoms, fever/chills.  Vaginal discharge - 3 days   - yellowish - no odor  - burning - spotting between periods - no known STD exposure, but would like to tested   Spotting between periods - did improve with change in OCPs was doing well - over 3 months starting having more spotting again, usually when on 3rd row  - after intercourse - small amount, bright bed   ROS: Per HPI  Objective:  Physical Exam: BP (!) 128/96   Pulse 82   Temp 98.8 F (37.1 C) (Oral)   Ht 5\' 3"  (1.6 m)   Wt 234 lb 12.8 oz (106.5 kg)   LMP 07/01/2017 (Approximate)   SpO2 98%   BMI 41.59 kg/m   Gen: NAD, resting comfortably CV: RRR with no murmurs appreciated Pulm: NWOB, CTAB with no crackles, wheezes, or rhonchi GI: Normal bowel sounds present. Soft, Nontender, Nondistended. No CVA tenderness GU: normal female. Cervix with diffusely friable tissue and mucopurulent discharge. No CMT on bimanual. MSK: no edema, cyanosis, or clubbing noted Skin: warm, dry Neuro: grossly normal, moves all extremities Psych: Normal affect and thought content  Results for orders placed or performed in visit on 07/27/17 (from the past 72 hour(s))  POCT Wet Prep [name]([facility] [place])     Status: Abnormal   Collection Time: 07/27/17  9:13 AM  Result Value Ref Range   Source Wet Prep POC vaginal    WBC, Wet Prep HPF POC >20    Bacteria Wet Prep HPF POC Many (A) Few   Clue Cells Wet Prep HPF POC Few (A) None     Clue Cells Wet Prep Whiff POC Positive Whiff    Yeast Wet Prep HPF POC None    Trichomonas Wet Prep HPF POC Absent Absent  POCT urinalysis dipstick     Status: Abnormal   Collection Time: 07/27/17  9:13 AM  Result Value Ref Range   Color, UA yellow yellow   Clarity, UA clear clear   Glucose, UA negative negative mg/dL   Bilirubin, UA negative negative   Ketones, POC UA negative negative mg/dL   Spec Grav, UA 1.4781.015 [id number] - 1.025   Blood, UA large (A) negative   pH, UA 6.0 5.0 - 8.0   Protein Ur, POC negative negative mg/dL   Urobilinogen, UA 0.2 0.2 or 1.0 E.U./dL   Nitrite, UA Negative Negative   Leukocytes, UA Trace (A) Negative  POCT urine pregnancy     Status: None   Collection Time: 07/27/17  9:13 AM  Result Value Ref Range   Preg Test, Ur Negative Negative  POCT UA - Microscopic Only     Status: None   Collection Time: 07/27/17  9:13 AM  Result Value Ref Range   WBC, Ur, HPF, POC 5-10    RBC, urine, microscopic >20    Bacteria, U Microscopic 3+    Epithelial cells, urine  per micros 1-5    Crystals, Ur, HPF, POC none    Casts, Ur, LPF, POC none    Yeast, UA none      Assessment/Plan:  Vaginal discharge Wet prep c/w BV. Rx for metronidazole sent in and patient informed of results. GC/Chlamydia collected today.  Intermenstrual bleeding UPT neg. Difficult to assess today given cervix is friable in the setting of acute vaginal infection. Discussed with patient to treat BV and RTC to further discuss if spotting continues. Suspect if continues may need work up for PCOS given patient's weight gain.   R flank pain Uncertain etiology. Unlikely to be a urinary tract or kidney infection given lack of urinary sx and no CVA tenderness. Suspect could be irritation from vaginal infection but no concern for PID given no CMT. Also could be MSK because of relief from positional changes. Discussed return precautions with patient and will continue to monitor.  Follow up in 1 month    Leland Her, DO PGY-2, [facility] 07/27/2017 8:57 AM

## 2017-07-28 LAB — CERVICOVAGINAL ANCILLARY ONLY
CHLAMYDIA, DNA PROBE: NEGATIVE
NEISSERIA GONORRHEA: NEGATIVE

## 2017-07-29 ENCOUNTER — Telehealth: Payer: Self-pay

## 2017-07-29 NOTE — Assessment & Plan Note (Addendum)
Wet prep c/w BV. Rx for metronidazole sent in and patient informed of results. GC/Chlamydia collected today.

## 2017-07-29 NOTE — Assessment & Plan Note (Signed)
UPT neg. Difficult to assess today given cervix is friable in the setting of acute vaginal infection. Discussed with patient to treat BV and RTC to further discuss if spotting continues. Suspect if continues may need work up for PCOS given patient's weight gain.

## 2017-07-29 NOTE — Telephone Encounter (Signed)
Pt contacted and informed of negative std results and negative urine results. Pt was appreciative and voiced understanding.

## 2017-07-29 NOTE — Telephone Encounter (Signed)
-----   Message from Leland HerElsia J Yoo, DO sent at 07/28/2017  5:52 PM EDT ----- Please call patient with neg results.

## 2017-08-04 ENCOUNTER — Other Ambulatory Visit: Payer: Self-pay | Admitting: Family Medicine

## 2017-08-04 DIAGNOSIS — N923 Ovulation bleeding: Secondary | ICD-10-CM

## 2017-08-27 ENCOUNTER — Ambulatory Visit: Payer: BLUE CROSS/BLUE SHIELD | Admitting: Family Medicine

## 2017-09-07 ENCOUNTER — Ambulatory Visit: Payer: BLUE CROSS/BLUE SHIELD | Admitting: Family Medicine

## 2017-09-18 ENCOUNTER — Encounter: Payer: Self-pay | Admitting: Family Medicine

## 2017-09-18 ENCOUNTER — Ambulatory Visit (INDEPENDENT_AMBULATORY_CARE_PROVIDER_SITE_OTHER): Payer: BLUE CROSS/BLUE SHIELD | Admitting: Family Medicine

## 2017-09-18 VITALS — BP 130/82 | HR 69 | Temp 98.4°F | Ht 63.0 in | Wt 236.0 lb

## 2017-09-18 DIAGNOSIS — N923 Ovulation bleeding: Secondary | ICD-10-CM | POA: Diagnosis not present

## 2017-09-18 DIAGNOSIS — M25551 Pain in right hip: Secondary | ICD-10-CM | POA: Diagnosis not present

## 2017-09-18 NOTE — Progress Notes (Signed)
    Subjective:  Erica [name] is a 31 y.o. female who presents to the [facility] today with a chief complaint of spotting between periods and R hip/side pain  HPI:  Spotting between peroids - Has continued to have spotting but less than before. Used to be worse, noticed significant improvement after switching from trisprintec to sprintec - Last time was earlier this month, almost always during the 3rd row of her OCPs - Sometimes after sex - only a small amount of spotting, bright red, only  few spots on pantyliner, more when wipe. No big clots. - Does not feel that is impacting her day to day life - no increase in facial hair or rashes  R sided pain - Located in her hip/side - Only happens at night when she lays on her right side, usually resolves after she gets up to use the bathroom and PTs - R hip pain that goes down her leg to the outside of her knee,  - worsens with standing for long periods or with activity, gets better with stretching - No numbness or weakness - No urinary frequency or urgency or dysuria    ROS: Per HPI  Objective:  Physical Exam: BP 130/82   Pulse 69   Temp 98.4 F (36.9 C) (Oral)   Ht  (1.6 m)   Wt 236 lb (107 kg)   LMP 08/27/2017   SpO2 97%   BMI 41.81 kg/m   Gen: NAD, resting comfortably CV: RRR with no murmurs appreciated Pulm: NWOB, CTAB with no crackles, wheezes, or rhonchi GI: Normal bowel sounds present. Soft, Nontender, Nondistended. MSK:5 out of 5 strength in both lower extremities, sensation intact, DTRs 2+ at patellar reflex. No tenderness to palpation over lower back. No lower extremity edema.  Skin: warm, dry. No rashes  Neuro: grossly normal, moves all extremities Psych: Normal affect and thought content   Assessment/Plan:  Intermenstrual bleeding Discussed with patient about proceeding with PCOS workup versus continuing current management with oral contraceptives. Patient opted for current management given that spotting  is minimal and she feels like she is managing this well at home.  Right hip pain Likely MSK in etiology given relief with stretching. Suspect that when patient is getting up in the middle the night she is loosening up her muscles which is giving her relief. She has no urinary symptoms and abdominal exam is benign so no concern for any intra-abdominal pathology. Advised symptomatic management at home with exercises and stretching as well as over-the-counter Tylenol/ibuprofen.   Leland Her, DO PGY-2, [facility] 09/18/2017 9:18 AM

## 2017-09-18 NOTE — Assessment & Plan Note (Signed)
Likely MSK in etiology given relief with stretching. Suspect that when patient is getting up in the middle the night she is loosening up her muscles which is giving her relief. She has no urinary symptoms and abdominal exam is benign so no concern for any intra-abdominal pathology. Advised symptomatic management at home with exercises and stretching as well as over-the-counter Tylenol/ibuprofen.

## 2017-09-18 NOTE — Assessment & Plan Note (Addendum)
Discussed with patient about proceeding with PCOS workup versus continuing current management with oral contraceptives. Patient opted for current management given that spotting is minimal and she feels like she is managing this well at home.

## 2017-09-18 NOTE — Patient Instructions (Signed)
It was good to see you today!  For your R sided/leg discomfort, - Try stretches and exercises of the R hip  - Over the counter ibuprofen and tylenol should also help  Please bring all of your medications with you to each visit.   Sign up for My Chart to have easy access to your labs results, and communication with your primary care physician.  Feel free to call with any questions or concerns at any time, at [phone].   Take care,  Dr. Leland Her, DO [facility]   Hip Exercises Ask your health care provider which exercises are safe for you. Do exercises exactly as told by your health care provider and adjust them as directed. It is normal to feel mild stretching, pulling, tightness, or discomfort as you do these exercises, but you should stop right away if you feel sudden pain or your pain gets worse.Do not begin these exercises until told by your health care provider. STRETCHING AND RANGE OF MOTION EXERCISES These exercises warm up your muscles and joints and improve the movement and flexibility of your hip. These exercises also help to relieve pain, numbness, and tingling. Exercise A: Hamstrings, Supine  1. Lie on your back. 2. Loop a belt or towel over the ball of your left / rightfoot. The ball of your foot is on the walking surface, right under your toes. 3. Straighten your left / rightknee and slowly pull on the belt to raise your leg. ? Do not let your left / right knee bend while you do this. ? Keep your other leg flat on the floor. ? Raise the left / right leg until you feel a gentle stretch behind your left / right knee or thigh. 4. Hold this position for __________ seconds. 5. Slowly return your leg to the starting position. Repeat __________ times. Complete this stretch __________ times a day. Exercise B: Hip Rotators  1. Lie on your back on a firm surface. 2. Hold your left / right knee with your left / right hand. Hold your ankle with your  other hand. 3. Gently pull your left / right knee and rotate your lower leg toward your other shoulder. ? Pull until you feel a stretch in your buttocks. ? Keep your hips and shoulders firmly planted while you do this stretch. 4. Hold this position for __________ seconds. Repeat __________ times. Complete this stretch __________ times a day. Exercise C: V-Sit (Hamstrings and Adductors)  1. Sit on the floor with your legs extended in a large "V" shape. Keep your knees straight during this exercise. 2. Start with your head and chest upright, then bend at your waist to reach for your left foot (position A). You should feel a stretch in your right inner thigh. 3. Hold this position for __________ seconds. Then slowly return to the upright position. 4. Bend at your waist to reach forward (position B). You should feel a stretch behind both of your thighs and knees. 5. Hold this position for __________ seconds. Then slowly return to the upright position. 6. Bend at your waist to reach for your right foot (position C). You should feel a stretch in your left inner thigh. 7. Hold this position for __________ seconds. Then slowly return to the upright position. Repeat __________ times. Complete this stretch __________ times a day. Exercise D: Lunge (Hip Flexors)  1. Place your left / right knee on the floor and bend your other knee so that is directly over  your ankle. You should be half-kneeling. 2. Keep good posture with your head over your shoulders. 3. Tighten your buttocks to point your tailbone downward. This helps your back to keep from arching too much. 4. You should feel a gentle stretch in the front of your left / right thigh and hip. If you do not feel any resistance, slightly slide your other foot forward and then slowly lunge forward so your knee once again lines up over your ankle. 5. Make sure your tailbone continues to point downward. 6. Hold this position for __________ seconds. Repeat  __________ times. Complete this stretch __________ times a day. STRENGTHENING EXERCISES These exercises build strength and endurance in your hip. Endurance is the ability to use your muscles for a long time, even after they get tired. Exercise E: Bridge (Hip Extensors)  1. Lie on your back on a firm surface with your knees bent and your feet flat on the floor. 2. Tighten your buttocks muscles and lift your bottom off the floor until the trunk of your body is level with your thighs. ? Do not arch your back. ? You should feel the muscles working in your buttocks and the back of your thighs. If you do not feel these muscles, slide your feet 1-2 inches (2.5-5 cm) farther away from your buttocks. 3. Hold this position for __________ seconds. 4. Slowly lower your hips to the starting position. 5. Let your muscles relax completely between repetitions. 6. If this exercise is too easy, try doing it with your arms crossed over your chest. Repeat __________ times. Complete this exercise __________ times a day. Exercise F: Straight Leg Raises - Hip Abductors  1. Lie on your side with your left / right leg in the top position. Lie so your head, shoulder, knee, and hip line up with each other. You may bend your bottom knee to help you balance. 2. Roll your hips slightly forward, so your hips are stacked directly over each other and your left / right knee is facing forward. 3. Leading with your heel, lift your top leg 4-6 inches (10-15 cm). You should feel the muscles in your outer hip lifting. ? Do not let your foot drift forward. ? Do not let your knee roll toward the ceiling. 4. Hold this position for __________ seconds. 5. Slowly return to the starting position. 6. Let your muscles relax completely between repetitions. Repeat __________ times. Complete this exercise __________ times a day. Exercise G: Straight Leg Raises - Hip Adductors  1. Lie on your side with your left / right leg in the bottom  position. Lie so your head, shoulder, knee, and hip line up. You may place your upper foot in front to help you balance. 2. Roll your hips slightly forward, so your hips are stacked directly over each other and your left / right knee is facing forward. 3. Tense the muscles in your inner thigh and lift your bottom leg 4-6 inches (10-15 cm). 4. Hold this position for __________ seconds. 5. Slowly return to the starting position. 6. Let your muscles relax completely between repetitions. Repeat __________ times. Complete this exercise __________ times a day. Exercise H: Straight Leg Raises - Quadriceps  1. Lie on your back with your left / right leg extended and your other knee bent. 2. Tense the muscles in the front of your left / right thigh. When you do this, you should see your kneecap slide up or see increased dimpling just above your knee. 3. Tighten  these muscles even more and raise your leg 4-6 inches (10-15 cm) off the floor. 4. Hold this position for __________ seconds. 5. Keep these muscles tense as you lower your leg. 6. Relax the muscles slowly and completely between repetitions. Repeat __________ times. Complete this exercise __________ times a day. Exercise I: Hip Abductors, Standing 1. Tie one end of a rubber exercise band or tubing to a secure surface, such as a table or pole. 2. Loop the other end of the band or tubing around your left / right ankle. 3. Keeping your ankle with the band or tubing directly opposite of the secured end, step away until there is tension in the tubing or band. Hold onto a chair as needed for balance. 4. Lift your left / right leg out to your side. While you do this: ? Keep your back upright. ? Keep your shoulders over your hips. ? Keep your toes pointing forward. ? Make sure to use your hip muscles to lift your leg. Do not "throw" your leg or tip your body to lift your leg. 5. Hold this position for __________ seconds. 6. Slowly return to the  starting position. Repeat __________ times. Complete this exercise __________ times a day. Exercise J: Squats (Quadriceps) 1. Stand in a door frame so your feet and knees are in line with the frame. You may place your hands on the frame for balance. 2. Slowly bend your knees and lower your hips like you are going to sit in a chair. ? Keep your lower legs in a straight-up-and-down position. ? Do not let your hips go lower than your knees. ? Do not bend your knees lower than told by your health care provider. ? If your hip pain increases, do not bend as low. 3. Hold this position for ___________ seconds. 4. Slowly push with your legs to return to standing. Do not use your hands to pull yourself to standing. Repeat __________ times. Complete this exercise __________ times a day. This information is not intended to replace advice given to you by your health care provider. Make sure you discuss any questions you have with your health care provider. Document Released: 12/26/2005 Document Revised: 09/01/2016 Document Reviewed: 12/03/2015 Elsevier Interactive Patient Education  [employer].

## 2017-12-24 ENCOUNTER — Telehealth: Payer: Self-pay | Admitting: *Deleted

## 2017-12-24 NOTE — Telephone Encounter (Signed)
Pt lm on nurse line.  She dropped her birth control in toilet earlier and wants to know what she should do.    Left detailed message (per DPR) explaining to take pill for tomorrow.  She will be one pill ahead for the rest of the month and will likely start her period a little sooner.   With her next pack, she will start as normal.  Advised to abstain from sex or use backup for the next few days. [place], Maryjo RochesterJessica Dawn, CMA

## 2018-02-10 ENCOUNTER — Other Ambulatory Visit: Payer: Self-pay

## 2018-02-10 ENCOUNTER — Other Ambulatory Visit (HOSPITAL_COMMUNITY)
Admission: RE | Admit: 2018-02-10 | Discharge: 2018-02-10 | Disposition: A | Discharge status: Home / Self Care | Payer: BLUE CROSS/BLUE SHIELD | Source: Ambulatory Visit | Attending: Family Medicine | Admitting: Family Medicine

## 2018-02-10 ENCOUNTER — Encounter: Payer: Self-pay | Admitting: Family Medicine

## 2018-02-10 ENCOUNTER — Ambulatory Visit (INDEPENDENT_AMBULATORY_CARE_PROVIDER_SITE_OTHER): Payer: BLUE CROSS/BLUE SHIELD | Admitting: Family Medicine

## 2018-02-10 VITALS — BP 118/70 | HR 97 | Temp 98.7°F | Wt 231.0 lb

## 2018-02-10 DIAGNOSIS — N898 Other specified noninflammatory disorders of vagina: Secondary | ICD-10-CM | POA: Diagnosis not present

## 2018-02-10 DIAGNOSIS — Z124 Encounter for screening for malignant neoplasm of cervix: Secondary | ICD-10-CM | POA: Diagnosis not present

## 2018-02-10 LAB — POCT WET PREP (WET MOUNT)
Clue Cells Wet Prep Whiff POC: NEGATIVE
Trichomonas Wet Prep HPF POC: ABSENT
WBC, Wet Prep HPF POC: >20

## 2018-02-10 MED ORDER — METRONIDAZOLE 500 MG PO TABS: 500.0000 mg | ORAL_TABLET | Freq: Two times a day (BID) | ORAL | 0 refills | Status: AC

## 2018-02-10 MED ORDER — FLUCONAZOLE 150 MG PO TABS: 150.0000 mg | ORAL_TABLET | Freq: Once | ORAL | 0 refills | Status: AC

## 2018-02-10 NOTE — Progress Notes (Signed)
Subjective:    Patient ID: Erica [name], female    DOB: 08-01-1986, 32 y.o.   MRN: [id number]   CC: vaginal discharge  HPI:  Vaginal Discharge - has been ongoing for 3 days  - Denies burning, abdominal pain, nausea or vomiting. Reports itching and discomfort - Discharge described as thick and yellow/ brown with no odor noticed.  - Patient reports no STD in the past.  - Sexully active with one female partner. Does not think he is having sex with any one else.  - About to start her period, she is spotting and is on OCP's.  - Denies burning with urination, no hematuria.  - Patient reports she has had BV in the past and this feels similart.  - Patient denies douching.   Review of Systems: Per HPI  Patient Active Problem List   Diagnosis Date Noted  . Right hip pain 09/18/2017  . Asthma, moderate persistent 04/10/2015  . Intermenstrual bleeding 01/08/2015  . Vaginal discharge 01/08/2015  . Birth control counseling 08/19/2014  . Well adult on routine health check 08/23/2013  . Rh negative status during pregnancy 08/27/2011  . Short cervix affecting pregnancy 08/05/2011  . Obesity 08/20/2009     Objective:  BP 118/70   Pulse 97   Temp 98.7 F (37.1 C) (Oral)   Wt 231 lb (104.8 kg)   LMP 01/08/2018 (Approximate)   SpO2 95%   BMI 40.92 kg/m  Vitals and nursing note reviewed  General: NAD, pleasant Abdomen: soft, nontender, nondistended GU/GYN: External genitalia within normal limits.  Vaginal mucosa pink, moist, normal rugae.  Nonfriable cervix without lesions, thick, yellow discharge noted with some minimal bleeding noted on speculum exam from cervical os. Exam performed in the presence of a chaperone. Extremities: no edema or cyanosis. WWP. Skin: warm and dry, no rashes noted Neuro: alert and oriented, no focal deficits Psych: normal affect  Assessment & Plan:  Health Maintenance: Pap-Smear performed.  Vaginal discharge Wet prep with some bacteria noted  and some yeast present. G/C Percell Locus is pending. Pap smear performed. - F/U if symptoms not improving or getting worse.  - Will f/u on G/C Chlamydia and call in Rx if positive.  - F/U with PCP as needed.  - Return precautions including abdominal pain, fever, chills, nausea, or vomiting given.  - Treatment with Flagyl 500 BID x 7 days. Diflucan x1 to be taken after flagyl.    [place] Mariavictoria Nottingham, DO Family Medicine Resident PGY-1

## 2018-02-10 NOTE — Assessment & Plan Note (Addendum)
Wet prep with some bacteria noted and some yeast present. G/C Percell Locus/Chlamydia is pending. Pap smear performed. - F/U if symptoms not improving or getting worse.  - Will f/u on G/C Chlamydia and call in Rx if positive.  - F/U with PCP as needed.  - Return precautions including abdominal pain, fever, chills, nausea, or vomiting given.  - Treatment with Flagyl 500 BID x 7 days. Diflucan x1 to be taken after flagyl.

## 2018-02-10 NOTE — Patient Instructions (Signed)
Thank you for coming to see me today. It was a pleasure! Today we talked about:   Your vaginal discharge. I will call you with the results and any medications you may need. We also performed your regular pap smear today.   Please follow-up with your regular doctor as needed.  If you have any questions or concerns, please do not hesitate to call the office at [phone].  Take Care,   [place] Koal Eslinger, DO

## 2018-02-16 LAB — CYTOLOGY - PAP
CHLAMYDIA, DNA PROBE: NEGATIVE
DIAGNOSIS: NEGATIVE
HPV: NOT DETECTED
Neisseria Gonorrhea: NEGATIVE
Trichomonas: NEGATIVE

## 2018-02-18 ENCOUNTER — Encounter: Payer: Self-pay | Admitting: *Deleted

## 2018-02-18 NOTE — Progress Notes (Signed)
Letter printed. Jazmin Hartsell,CMA

## 2018-04-28 ENCOUNTER — Encounter: Payer: Self-pay | Admitting: Obstetrics & Gynecology

## 2018-04-28 ENCOUNTER — Ambulatory Visit (INDEPENDENT_AMBULATORY_CARE_PROVIDER_SITE_OTHER): Payer: BLUE CROSS/BLUE SHIELD | Admitting: Obstetrics & Gynecology

## 2018-04-28 ENCOUNTER — Ambulatory Visit (HOSPITAL_BASED_OUTPATIENT_CLINIC_OR_DEPARTMENT_OTHER)
Admission: RE | Admit: 2018-04-28 | Discharge: 2018-04-28 | Disposition: A | Discharge status: Home / Self Care | Payer: BLUE CROSS/BLUE SHIELD | Source: Ambulatory Visit | Attending: Obstetrics & Gynecology | Admitting: Obstetrics & Gynecology

## 2018-04-28 ENCOUNTER — Encounter: Payer: BLUE CROSS/BLUE SHIELD | Admitting: Obstetrics & Gynecology

## 2018-04-28 ENCOUNTER — Encounter (HOSPITAL_BASED_OUTPATIENT_CLINIC_OR_DEPARTMENT_OTHER): Payer: Self-pay

## 2018-04-28 VITALS — BP 122/77 | HR 79 | Ht 63.0 in | Wt 233.0 lb

## 2018-04-28 DIAGNOSIS — N939 Abnormal uterine and vaginal bleeding, unspecified: Secondary | ICD-10-CM

## 2018-04-28 DIAGNOSIS — N83292 Other ovarian cyst, left side: Secondary | ICD-10-CM | POA: Diagnosis not present

## 2018-04-28 NOTE — Patient Instructions (Signed)
GO WHITE: (methods to control or eliminate BV)   Dove soap (light green writing) All UNSCENTED sanitary products (tampons, pads and panty liners) 100% cotton, white underwear UNSCENTED detergent (i.e. Dreft) to wash underclothes and wash clothes 100% cotton white wash clothes  Kajsa Butrum L. Harraway-Smith, M.D., [facility]

## 2018-04-28 NOTE — Progress Notes (Signed)
Subjective:     Erica [name] is a 32 y.o. female G3P3 here for a routine exam.  LMP 3 weeks.  Current complaints: intermenstrual bleeding.   Pt wants to establish a GYN provider.  She repots that for 12 months she has had spotting between cycles about 1 week prior to for 1-2 days. She is on OCPs. She was on Tri-Sprintec and was switched to Sprintec. She starts her cycles 1 week before the inactive pills- in the 3rd week. She has been on OCPs for 6 years.  Pt reports some weight gain. Pt reports some acne on her face but, not heat or cold intolerance.  Pt denies skipped menses. Pt denies pain with her menses. Her cycles length is usually 7 days. She reports heavy bleeding usually but the last cycles was lighter.    No FH of female cancers.    Gynecologic History No LMP recorded. Contraception: OCP (estrogen/progesterone) Last Pap: 01/2017. Results were: normal Last mammogram: n/a.   Obstetric History OB History  Gravida Para Term Preterm AB Living  0 0 3  SAB TAB Ectopic Multiple Live Births  0 0 0 0 3    # Outcome Date GA Lbr Len/2nd Weight Sex Delivery Anes PTL Lv  3 Term 09/17/11 [redacted]w[redacted]d 09:56 / 00:38 10 lb 1.4 oz (4.576 kg) M Vag-Spont EPI  LIV     Birth Comments: wnl  2 Term 03/31/09 [redacted]w[redacted]d  7 lb 13 oz (3.544 kg) F Vag-Spont  N LIV  1 Term 09/02/06 [redacted]w[redacted]d  7 lb 12 oz (3.515 kg) F Vag-Spont  N LIV   The following portions of the patient's history were reviewed and updated as appropriate: allergies, current medications, past family history, past medical history, past social history, past surgical history and problem list.  Review of Systems Pertinent items are noted in HPI.    Objective:  BP 122/77 (BP Location: Left Arm)   Pulse 79   Ht  (1.6 m)   Wt 233 lb (105.7 kg)   LMP 04/10/2018 (Exact Date)   BMI 41.27 kg/m   CONSTITUTIONAL: Well-developed, well-nourished female in no acute distress.  HENT:  Normocephalic, atraumatic EYES: Conjunctivae and EOM are  normal. No scleral icterus.  NECK: Normal range of motion SKIN: Skin is warm and dry. No rash noted. Not diaphoretic.No pallor. NEUROLGIC: Alert and oriented to person, place, and time. Normal coordination.  Lungs: CTA CV: RRR Abd: obese, NT, ND GU: EGBUS: no lesions Vagina: no blood in vault Cervix: no lesion; no mucopurulent d/c; no CMT Uterus: small, mobile Adnexa: no masses; nontender    Assessment:   AUB- not sure if related to the OCPs or some endometrial pathology. Will get [place] to eval and also check her thyroid hormone.     Plan:    Contraception: keep Orthocylen for now Lab: TSH, CBC adm HgA1C Pelvic US F/u in 3 months  If a change of pills is indicated we can do this via MyChart.   Grethel Zenk L. Harraway-Smith, M.D., [facility]

## 2018-04-28 NOTE — Progress Notes (Signed)
Patient having bleeding between periods. She states that the bleeding is not heavy but more than just spotting between periods.last pap 02-10-18 Armandina Stammer RN

## 2018-04-29 LAB — HEMOGLOBIN A1C
Est. average glucose Bld gHb Est-mCnc: 100 mg/dL
Hgb A1c MFr Bld: 5.1 % (ref 4.8–5.6)

## 2018-04-29 LAB — TSH: TSH: 3.62 u[IU]/mL (ref 0.450–4.500)

## 2018-04-29 LAB — CBC
HEMATOCRIT: 37.9 % (ref 34.0–46.6)
HEMOGLOBIN: 12.4 g/dL (ref 11.1–15.9)
MCH: 27 pg (ref 26.6–33.0)
MCHC: 32.7 g/dL (ref 31.5–35.7)
MCV: 82 fL (ref 79–97)
PLATELETS: 265 10*3/uL (ref 150–379)
RBC: 4.6 x10E6/uL (ref 3.77–5.28)
RDW: 13.6 % (ref 12.3–15.4)
WBC: 6 10*3/uL (ref 3.4–10.8)

## 2018-05-04 ENCOUNTER — Encounter: Payer: Self-pay | Admitting: Obstetrics & Gynecology

## 2018-05-08 ENCOUNTER — Other Ambulatory Visit: Payer: Self-pay | Admitting: Family Medicine

## 2018-05-08 DIAGNOSIS — N923 Ovulation bleeding: Secondary | ICD-10-CM

## 2018-05-15 ENCOUNTER — Encounter: Payer: Self-pay | Admitting: Obstetrics & Gynecology

## 2018-05-24 ENCOUNTER — Ambulatory Visit: Payer: BLUE CROSS/BLUE SHIELD | Admitting: Obstetrics & Gynecology

## 2018-08-11 ENCOUNTER — Ambulatory Visit (INDEPENDENT_AMBULATORY_CARE_PROVIDER_SITE_OTHER): Payer: BLUE CROSS/BLUE SHIELD | Admitting: Obstetrics & Gynecology

## 2018-08-11 ENCOUNTER — Encounter: Payer: Self-pay | Admitting: Obstetrics & Gynecology

## 2018-08-11 VITALS — BP 123/79 | HR 60 | Ht 63.0 in | Wt 224.0 lb

## 2018-08-11 DIAGNOSIS — Z01812 Encounter for preprocedural laboratory examination: Secondary | ICD-10-CM

## 2018-08-11 DIAGNOSIS — Z30430 Encounter for insertion of intrauterine contraceptive device: Secondary | ICD-10-CM

## 2018-08-11 DIAGNOSIS — Z3202 Encounter for pregnancy test, result negative: Secondary | ICD-10-CM

## 2018-08-11 LAB — POCT URINE PREGNANCY: Preg Test, Ur: NEGATIVE

## 2018-08-11 MED ORDER — LEVONORGESTREL 20 MCG/24HR IU IUD
INTRAUTERINE_SYSTEM | Freq: Once | INTRAUTERINE | Status: AC
  Administered 2018-08-11: 1 via INTRAUTERINE

## 2018-08-11 NOTE — Patient Instructions (Signed)

## 2018-08-11 NOTE — Progress Notes (Signed)
History:  32 y.o. [id number] here today for request for permanent sterilization. LMP 07/31/2018 On OCPs.  Pt requests BTL.  Pts husband is skeptical.  Pt has had a Mirena prev and had a lot of bleeding. Pt had it for >1 year and it feel out.  Pt reports heavy bleeding and cramping with her cycles. She has also had the Implanon.        The following portions of the patient's history were reviewed and updated as appropriate: allergies, current medications, past family history, past medical history, past social history, past surgical history and problem list.  Review of Systems:  Pertinent items are noted in HPI.    Objective:  Physical Exam Blood pressure 123/79, pulse 60, height 5\' 3"  (1.6 m), weight 224 lb (101.6 kg).  CONSTITUTIONAL: Well-developed, well-nourished female in no acute distress.  HENT:  Normocephalic, atraumatic EYES: Conjunctivae and EOM are normal. No scleral icterus.  NECK: Normal range of motion SKIN: Skin is warm and dry. No rash noted. Not diaphoretic.No pallor. NEUROLGIC: Alert and oriented to person, place, and time. Normal coordination.   GYNECOLOGY CLINIC PROCEDURE NOTE  IUD Insertion Procedure Note Patient identified, informed consent performed.  Discussed risks of irregular bleeding, cramping, infection, malpositioning or misplacement of the IUD outside the uterus which may require further procedures. Time out was performed.  Urine pregnancy test negative.  Speculum placed in the vagina.  Cervix visualized.  Cleaned with Betadine x 2.  Grasped anteriorly with a single tooth tenaculum.  Uterus sounded to 8 cm.  Mirena IUD placed per manufacturer's recommendations.  Strings trimmed to 3 cm. Tenaculum was removed, good hemostasis noted.  Patient tolerated procedure well.    Assessment & Plan:  Contraception management. Pt wasn't BTL but, husband is hesitant. Reviewed options. Pt wants LnIUD  Patient was given post-procedure instructions.  Patient was asked to follow  up in 4 weeks for IUD check.  Delisha Peaden L. Harraway-Smith, M.D., [facility]AC[facility]

## 2018-08-11 NOTE — Progress Notes (Signed)
Patient is interested in a tubal ligation. Armandina StammerJennifer Howard RN

## 2018-09-15 ENCOUNTER — Ambulatory Visit: Payer: BLUE CROSS/BLUE SHIELD | Admitting: Obstetrics & Gynecology

## 2018-10-07 ENCOUNTER — Ambulatory Visit: Payer: BLUE CROSS/BLUE SHIELD | Admitting: Obstetrics & Gynecology

## 2018-10-14 ENCOUNTER — Ambulatory Visit (INDEPENDENT_AMBULATORY_CARE_PROVIDER_SITE_OTHER): Payer: BLUE CROSS/BLUE SHIELD | Admitting: Family Medicine

## 2018-10-14 ENCOUNTER — Encounter: Payer: Self-pay | Admitting: Family Medicine

## 2018-10-14 VITALS — BP 126/91 | HR 66 | Resp 16 | Ht 63.0 in | Wt 218.0 lb

## 2018-10-14 DIAGNOSIS — Z30431 Encounter for routine checking of intrauterine contraceptive device: Secondary | ICD-10-CM

## 2018-10-14 NOTE — Progress Notes (Signed)
   Subjective:   Patient Name: Erica [name], female   DOB: 01-28-1986, 32 y.o.  MRN: [id number]  HPI Patient here for an IUD check.  She had the Liletta IUD placed 1 month ago.  She reports intermittent bleeding.   Review of Systems  Constitutional: Negative for fever and chills.  Gastrointestinal: Negative for abdominal pain.  Genitourinary: Negative for vaginal discharge, vaginal pain, pelvic pain and dyspareunia.        Objective:   Physical Exam  Constitutional: She appears well-developed and well-nourished.  HENT:  Head: Normocephalic and atraumatic.  Abdominal: Soft. There is no tenderness. There is no guarding.  Genitourinary: There is no rash, tenderness or lesion on the right labia. There is no rash, tenderness or lesion on the left labia. No erythema or tenderness in the vagina. No foreign body around the vagina. No signs of injury around the vagina. No vaginal discharge found.    Skin: Skin is warm and dry.  Psychiatric: She has a normal mood and affect. Her behavior is normal. Judgment and thought content normal.       Assessment & Plan:  1. IUD check up IUD in place.  Pt to call with any other problems.  Recheck in 1 year.

## 2018-10-15 ENCOUNTER — Encounter: Payer: Self-pay | Admitting: Family Medicine

## 2018-11-12 ENCOUNTER — Encounter (HOSPITAL_BASED_OUTPATIENT_CLINIC_OR_DEPARTMENT_OTHER): Payer: Self-pay | Admitting: Emergency Medicine

## 2018-11-12 ENCOUNTER — Emergency Department (HOSPITAL_BASED_OUTPATIENT_CLINIC_OR_DEPARTMENT_OTHER)
Admission: EM | Admit: 2018-11-12 | Discharge: 2018-11-12 | Disposition: A | Discharge status: Home / Self Care | Payer: BLUE CROSS/BLUE SHIELD | Attending: Emergency Medicine | Admitting: Emergency Medicine

## 2018-11-12 ENCOUNTER — Other Ambulatory Visit: Payer: Self-pay

## 2018-11-12 DIAGNOSIS — K610 Anal abscess: Secondary | ICD-10-CM | POA: Diagnosis present

## 2018-11-12 DIAGNOSIS — J45909 Unspecified asthma, uncomplicated: Secondary | ICD-10-CM | POA: Insufficient documentation

## 2018-11-12 DIAGNOSIS — D649 Anemia, unspecified: Secondary | ICD-10-CM | POA: Insufficient documentation

## 2018-11-12 MED ORDER — SULFAMETHOXAZOLE-TRIMETHOPRIM 800-160 MG PO TABS
1.0000 | ORAL_TABLET | Freq: Once | ORAL | Status: AC
  Administered 2018-11-12: 1 via ORAL
  Filled 2018-11-12: qty 1

## 2018-11-12 MED ORDER — LIDOCAINE-EPINEPHRINE (PF) 2 %-1:200000 IJ SOLN
10.0000 mL | Freq: Once | INTRAMUSCULAR | Status: AC
  Administered 2018-11-12: 10 mL
  Filled 2018-11-12 (×2): qty 10

## 2018-11-12 MED ORDER — SULFAMETHOXAZOLE-TRIMETHOPRIM 800-160 MG PO TABS: 1.0000 | ORAL_TABLET | Freq: Two times a day (BID) | ORAL | 0 refills | Status: AC

## 2018-11-12 NOTE — ED Provider Notes (Addendum)
[facility] EMERGENCY DEPARTMENT Provider Note   CSN: [id number] Arrival date & time: 11/12/18  0207     History   Chief Complaint Chief Complaint  Patient presents with  . Abscess    HPI Erica [name] is a 32 y.o. female.  The history is provided by the patient.  She has history of asthma and comes in complaining of an abscess near her vagina, on the right side.  She noted swelling 2 days ago and is gotten progressively worse.  Pain is now rated at 10/10.  She denies fever or chills.  She is tried warm soaks without any improvement.  Past Medical History:  Diagnosis Date  . Anemia   . Asthma   . Pregnancy induced hypertension   . Rh incompatibility   . Varicosities     Patient Active Problem List   Diagnosis Date Noted  . Right hip pain 09/18/2017  . Asthma, moderate persistent 04/10/2015  . Intermenstrual bleeding 01/08/2015  . Vaginal discharge 01/08/2015  . Birth control counseling 08/19/2014  . Well adult on routine health check 08/23/2013  . Rh negative status during pregnancy 08/27/2011  . Short cervix affecting pregnancy 08/05/2011  . Obesity 08/20/2009    History reviewed. No pertinent surgical history.   OB History    Gravida  3   Para  3   Term  3   Preterm  0   AB  0   Living  3     SAB  0   TAB  0   Ectopic  0   Multiple  0   Live Births  3            Home Medications    Prior to Admission medications   Medication Sig Start Date End Date Taking? Authorizing Provider  albuterol (VENTOLIN HFA) 108 (90 Base) MCG/ACT inhaler INHALE ONE TO TWO PUFFS BY MOUTH EVERY 4 HOURS AS NEEDED FOR SHORTNESS OF BREATH 08/06/16   Leland Her, DO  beclomethasone (QVAR) 40 MCG/ACT inhaler Inhale 1 puff into the lungs 2 (two) times daily. Patient not taking: Reported on 04/28/2018 04/10/15   Leona Singleton, MD  betamethasone dipropionate (DIPROLENE) 0.05 % cream Apply topically 2 (two) times daily. Patient not taking:  Reported on 04/28/2018 10/16/15   Raliegh Ip, DO  fluticasone (FLONASE) 50 MCG/ACT nasal spray Place 1 spray into both nostrils daily. Patient not taking: Reported on 08/11/2018 03/21/15   Leona Singleton, MD  levonorgestrel (MIRENA) 20 MCG/24HR IUD 1 each by Intrauterine route once.    [provider]  mupirocin ointment (BACTROBAN) 2 % Place 1 application into the nose 2 (two) times daily. Patient not taking: Reported on 04/28/2018 08/17/14   Glori Luis, MD    Family History Family History  Problem Relation Age of Onset  . Asthma Mother   . Hypertension Mother   . Hypertension Father   . Drug abuse Father   . Hepatitis Daughter     Social History Social History   Tobacco Use  . Smoking status: Never Smoker  . Smokeless tobacco: Never Used  Substance Use Topics  . Alcohol use: No  . Drug use: No     Allergies   Patient has no known allergies.   Review of Systems Review of Systems  All other systems reviewed and are negative.    Physical Exam Updated Vital Signs BP (!) 132/93 (BP Location: Left Arm)   Pulse 74   Temp 98.6  F (37 C) (Oral)   Resp 16   Ht 5\' 2"  (1.575 m)   Wt 98.9 kg   SpO2 98%   BMI 39.87 kg/m   Physical Exam  Nursing note and vitals reviewed.  32 year old female, resting comfortably and in no acute distress. Vital signs are significant for elevated diastolic blood pressure. Oxygen saturation is 998%, which is normal. Head is normocephalic and atraumatic. PERRLA, EOMI. Oropharynx is clear. Neck is nontender and supple without adenopathy or JVD. Back is nontender and there is no CVA tenderness. Lungs are clear without rales, wheezes, or rhonchi. Chest is nontender. Heart has regular rate and rhythm without murmur. Abdomen is soft, flat, nontender without masses or hepatosplenomegaly and peristalsis is normoactive. Rectal: Right-sided perianal/perineal abscess. Extremities have no cyanosis or edema, full range of  motion is present. Skin is warm and dry without rash. Neurologic: Mental status is normal, cranial nerves are intact, there are no motor or sensory deficits.  ED Treatments / Results   Procedures [place] bedside Date/Time: 11/12/2018 3:09 AM Performed by: Dione Booze, MD Authorized by: Dione Booze, MD  Consent: Verbal consent obtained. Written consent not obtained. Risks and benefits: risks, benefits and alternatives were discussed Consent given by: patient Patient understanding: patient states understanding of the procedure being performed Patient consent: the patient's understanding of the procedure matches consent given Procedure consent: procedure consent matches procedure scheduled Relevant documents: relevant documents present and verified Site marked: the operative site was marked Required items: required blood products, implants, devices, and special equipment available Patient identity confirmed: verbally with patient and arm band Time out: Immediately prior to procedure a "time out" was called to verify the correct patient, procedure, equipment, support staff and site/side marked as required. Local anesthesia used: no  Anesthesia: Local anesthesia used: no  Sedation: Patient sedated: no  Patient tolerance: Patient tolerated the procedure well with no immediate complications Comments: Limited bedside ultrasound done to evaluate swollen area in the right perineum/perianal area.  Findings: Hypoechogenic area consistent with abscess.  Images archived electronically.  Marland Kitchen.Incision and Drainage Date/Time: 11/12/2018 3:41 AM Performed by: Dione Booze, MD Authorized by: Dione Booze, MD   Consent:    Consent obtained:  Verbal   Consent given by:  Patient   Risks discussed:  Bleeding, pain, incomplete drainage and infection   Alternatives discussed:  Alternative treatment Location:    Type:  Abscess   Size:  4 cm   Location:  Anogenital   Anogenital location:   Perianal Pre-procedure details:    Skin preparation:  Chloraprep Anesthesia (see MAR for exact dosages):    Anesthesia method:  Local infiltration   Local anesthetic:  Lidocaine 2% WITH epi Procedure type:    Complexity:  Complex Procedure details:    Needle aspiration: no     Incision types:  Cruciate   Incision depth:  Subcutaneous   Scalpel blade:  11   Wound management:  Probed and deloculated   Drainage:  Purulent   Drainage amount:  Copious   Wound treatment:  Wound left open   Packing materials:  None Post-procedure details:    Patient tolerance of procedure:  Tolerated well, no immediate complications    Medications Ordered in ED Medications  sulfamethoxazole-trimethoprim (BACTRIM DS,SEPTRA DS) 800-160 MG per tablet 1 tablet (has no administration in time range)  lidocaine-EPINEPHrine (XYLOCAINE W/EPI) 2 %-1:200000 (PF) injection 10 mL (10 mLs Infiltration Given 11/12/18 0321)     Initial Impression / Assessment and Plan / ED Course  I have reviewed the triage vital signs and the nursing notes.  Perianal abscess treated with incision and drainage.  She is discharged with prescription for trimethoprim-sulfamethoxazole.  Final Clinical Impressions(s) / ED Diagnoses   Final diagnoses:  Perianal abscess    ED Discharge Orders         Ordered    sulfamethoxazole-trimethoprim (BACTRIM DS,SEPTRA DS) 800-160 MG tablet  2 times daily     11/12/18 0343           Dione BoozeGlick, Jenevieve Kirschbaum, MD 11/12/18 0345    Dione BoozeGlick, Lachanda Buczek, MD 11/12/18 [id number]

## 2018-11-12 NOTE — ED Triage Notes (Signed)
Pt c/o abscess on her groin for the past 3 days getting worse tonight.

## 2018-11-24 ENCOUNTER — Ambulatory Visit: Payer: BLUE CROSS/BLUE SHIELD | Admitting: Family Medicine

## 2018-11-24 ENCOUNTER — Encounter: Payer: Self-pay | Admitting: Family Medicine

## 2019-01-01 ENCOUNTER — Encounter: Payer: Self-pay | Admitting: Family Medicine

## 2019-01-19 ENCOUNTER — Ambulatory Visit (INDEPENDENT_AMBULATORY_CARE_PROVIDER_SITE_OTHER): Payer: BLUE CROSS/BLUE SHIELD

## 2019-01-19 VITALS — Ht 63.0 in | Wt 218.0 lb

## 2019-01-19 DIAGNOSIS — N760 Acute vaginitis: Secondary | ICD-10-CM

## 2019-01-19 DIAGNOSIS — N898 Other specified noninflammatory disorders of vagina: Secondary | ICD-10-CM

## 2019-01-19 DIAGNOSIS — Z113 Encounter for screening for infections with a predominantly sexual mode of transmission: Secondary | ICD-10-CM | POA: Diagnosis not present

## 2019-01-19 DIAGNOSIS — B9689 Other specified bacterial agents as the cause of diseases classified elsewhere: Secondary | ICD-10-CM

## 2019-01-19 NOTE — Progress Notes (Addendum)
SUBJECTIVE:  33 y.o. female complains of odorous vaginal discharge for2 day(s). Denies abnormal vaginal bleeding or significant pelvic pain or fever. No UTI symptoms. Denies history of known exposure to STD.  No LMP recorded.  OBJECTIVE:  She appears well, afebrile.   ASSESSMENT:  Vaginal Discharge  Vaginal Odor and itching.   PLAN:  GC, chlamydia, trichomonas, BVAG, CVAG probe sent to lab. Treatment: To be determined once lab results are received ROV prn if symptoms persist or worsen. Armandina Stammer RN  Attestation of Attending Supervision of RN: Evaluation and management procedures were performed by the nurse under my supervision and collaboration.  I have reviewed the nursing note and chart, and I agree with the management and plan.  Carolyn L. Harraway-Smith, M.D., Evern Core

## 2019-01-20 LAB — CERVICOVAGINAL ANCILLARY ONLY
Bacterial vaginitis: POSITIVE — AB
CHLAMYDIA, DNA PROBE: NEGATIVE
Candida vaginitis: NEGATIVE
NEISSERIA GONORRHEA: NEGATIVE
TRICH (WINDOWPATH): NEGATIVE

## 2019-01-23 ENCOUNTER — Other Ambulatory Visit: Payer: Self-pay | Admitting: Obstetrics & Gynecology

## 2019-01-23 MED ORDER — METRONIDAZOLE 500 MG PO TABS: 500.0000 mg | ORAL_TABLET | Freq: Two times a day (BID) | ORAL | 0 refills | Status: DC

## 2019-01-23 NOTE — Addendum Note (Signed)
Addended by: Willodean Rosenthal on: 01/23/2019 10:39 AM   Modules accepted: Orders

## 2019-01-25 ENCOUNTER — Encounter: Payer: Self-pay | Admitting: Family Medicine

## 2019-05-26 ENCOUNTER — Ambulatory Visit: Payer: BC Managed Care – PPO

## 2019-05-26 ENCOUNTER — Other Ambulatory Visit: Payer: Self-pay

## 2019-05-26 VITALS — BP 118/80 | HR 82

## 2019-05-26 DIAGNOSIS — N898 Other specified noninflammatory disorders of vagina: Secondary | ICD-10-CM

## 2019-05-26 NOTE — Progress Notes (Addendum)
  33 y.o. female complains of white vaginal discharge for 7 day(s). Patient states it has a fishy odor to it. Patient states she notices it more after intercourse Denies abnormal vaginal bleeding or significant pelvic pain or fever. No UTI symptoms. Denies history of known exposure to STD. Patient had GC/Chl done in Jan 2020 and both were negative.  Patient made aware depending on results - if she is positive for BV again we will have her do a visit with the MD to discuss recurrent BV. Patient agrees with plan. Armandina Stammer RN  Attestation of Attending Supervision of RN: Evaluation and management procedures were performed by the nurse under my supervision and collaboration.  I have reviewed the nursing note and chart, and I agree with the management and plan.  Carolyn L. Harraway-Smith, M.D., [place]n Core

## 2019-05-27 LAB — CERVICOVAGINAL ANCILLARY ONLY
Bacterial vaginitis: POSITIVE — AB
Candida vaginitis: NEGATIVE

## 2019-05-30 ENCOUNTER — Telehealth: Payer: Self-pay

## 2019-05-30 ENCOUNTER — Other Ambulatory Visit: Payer: Self-pay | Admitting: Family Medicine

## 2019-05-30 DIAGNOSIS — N760 Acute vaginitis: Secondary | ICD-10-CM

## 2019-05-30 DIAGNOSIS — B9689 Other specified bacterial agents as the cause of diseases classified elsewhere: Secondary | ICD-10-CM

## 2019-05-30 MED ORDER — METRONIDAZOLE 500 MG PO TABS: 500.0000 mg | ORAL_TABLET | Freq: Two times a day (BID) | ORAL | 0 refills | Status: DC

## 2019-05-30 NOTE — Telephone Encounter (Signed)
Called pt with results.Pt made aware of positive BV results. Medication was sent to the pharmacy.  Understanding was voiced. Erica [name], CMA

## 2019-07-27 ENCOUNTER — Other Ambulatory Visit: Payer: Self-pay

## 2019-07-27 ENCOUNTER — Ambulatory Visit (INDEPENDENT_AMBULATORY_CARE_PROVIDER_SITE_OTHER): Payer: BC Managed Care – PPO | Admitting: Obstetrics & Gynecology

## 2019-07-27 ENCOUNTER — Encounter: Payer: Self-pay | Admitting: Obstetrics & Gynecology

## 2019-07-27 VITALS — BP 120/62 | HR 56 | Ht 63.0 in | Wt 231.0 lb

## 2019-07-27 DIAGNOSIS — Z124 Encounter for screening for malignant neoplasm of cervix: Secondary | ICD-10-CM | POA: Diagnosis not present

## 2019-07-27 DIAGNOSIS — N898 Other specified noninflammatory disorders of vagina: Secondary | ICD-10-CM

## 2019-07-27 DIAGNOSIS — Z01419 Encounter for gynecological examination (general) (routine) without abnormal findings: Secondary | ICD-10-CM

## 2019-07-27 DIAGNOSIS — Z1151 Encounter for screening for human papillomavirus (HPV): Secondary | ICD-10-CM

## 2019-07-27 DIAGNOSIS — N760 Acute vaginitis: Secondary | ICD-10-CM

## 2019-07-27 DIAGNOSIS — B9689 Other specified bacterial agents as the cause of diseases classified elsewhere: Secondary | ICD-10-CM

## 2019-07-27 MED ORDER — METRONIDAZOLE 500 MG PO TABS: 500.0000 mg | ORAL_TABLET | Freq: Two times a day (BID) | ORAL | 4 refills | Status: DC

## 2019-07-27 NOTE — Patient Instructions (Signed)
GO WHITE: Soap: UNSCENTED Dove (white box light green writing) Laundry detergent (underwear)- Dreft or [employer] unscented WHITE 100% cotton panties (NOT just cotton crouch) Sanitary napkin/panty liners: UNSCENTED.  If it doesn't SAY unscented it can have a scent/perfume    NO PERFUMES OR LOTIONS OR POTIONS in the vulvar area (may use regular [place]) Condoms: hypoallergenic only. Non dyed (no color) Toilet papers: white only Wash clothes: use a separate wash cloth. WHITE.  Washed in Dreft.  

## 2019-07-27 NOTE — Progress Notes (Signed)
Subjective:     Erica [name] is a 33 y.o. female here for a routine exam.  Current complaints: recurrent BV. She had not had it for some time prior to her IUD. Pt is monogamous. No new sexual partner.  She reports occ spotting only with LnIUD.    Gynecologic History No LMP recorded. (Menstrual status: IUD). Contraception: IUD Last Pap: 02/10/2018. Results were: normal Last mammogram: n/a.   Obstetric History OB History  Gravida Para Term Preterm AB Living  3 3 3  0 0 3  SAB TAB Ectopic Multiple Live Births  0 0 0 0 3    # Outcome Date GA Lbr Len/2nd Weight Sex Delivery Anes PTL Lv  3 Term 09/17/11 [redacted]w[redacted]d 09:56 / 00:38 10 lb 1.4 oz (4.576 kg) M Vag-Spont EPI  LIV     Birth Comments: wnl  2 Term 03/31/09 [redacted]w[redacted]d  7 lb 13 oz (3.544 kg) F Vag-Spont  N LIV  1 Term 09/02/06 [redacted]w[redacted]d  7 lb 12 oz (3.515 kg) F Vag-Spont  N LIV   The following portions of the patient's history were reviewed and updated as appropriate: allergies, current medications, past family history, past medical history, past social history, past surgical history and problem list.  Review of Systems Pertinent items are noted in HPI.    Objective:   BP 120/62   Pulse (!) 56   Ht 5\' 3"  (1.6 m)   Wt 231 lb 0.6 oz (104.8 kg)   BMI 40.93 kg/m  General Appearance:    Alert, cooperative, no distress, appears stated age  Head:    Normocephalic, without obvious abnormality, atraumatic  Eyes:    conjunctiva/corneas clear, EOM's intact, both eyes  Ears:    Normal external ear canals, both ears  Nose:   Nares normal, septum midline, mucosa normal, no drainage    or sinus tenderness  Throat:   Lips, mucosa, and tongue normal; teeth and gums normal  Neck:   Supple, symmetrical, trachea midline, no adenopathy;    thyroid:  no enlargement/tenderness/nodules  Back:     Symmetric, no curvature, ROM normal, no CVA tenderness  Lungs:     respirations unlabored  Chest Wall:    No tenderness or deformity   Heart:    Regular rate  and rhythm  Breast Exam:    No tenderness, masses, or nipple abnormality  Abdomen:     Soft, non-tender, bowel sounds active all four quadrants,    no masses, no organomegaly  Genitalia:    Normal female without lesion, discharge or tenderness     Extremities:   Extremities normal, atraumatic, no cyanosis or edema  Pulses:   2+ and symmetric all extremities  Skin:   Skin color, texture, turgor normal, no rashes or lesions    Assessment:    Healthy female exam.   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 Has had 2x since June. Will retest today. Pt may need long term tx however, a LnIUD is a risk factor for recurrent sx.   Plan:    Follow up in: 1 year.    F/u sooner prn F/u PAP  Flagyl 500mg  bid x 7 days  Candence Sease L. Harraway-Smith, M.D., Cherlynn June

## 2019-07-28 ENCOUNTER — Ambulatory Visit: Payer: BC Managed Care – PPO | Admitting: Obstetrics & Gynecology

## 2019-07-30 LAB — CERVICOVAGINAL ANCILLARY ONLY
Bacterial vaginitis: NEGATIVE
Candida vaginitis: NEGATIVE

## 2019-08-01 LAB — CYTOLOGY - PAP
Diagnosis: NEGATIVE
HPV: NOT DETECTED

## 2020-01-18 ENCOUNTER — Encounter: Payer: Self-pay | Admitting: Family Medicine

## 2020-03-07 ENCOUNTER — Other Ambulatory Visit: Payer: Self-pay

## 2020-03-07 ENCOUNTER — Encounter: Payer: Self-pay | Admitting: Obstetrics & Gynecology

## 2020-03-07 ENCOUNTER — Ambulatory Visit (INDEPENDENT_AMBULATORY_CARE_PROVIDER_SITE_OTHER): Payer: BC Managed Care – PPO | Admitting: Obstetrics & Gynecology

## 2020-03-07 VITALS — BP 123/85 | HR 70 | Ht 63.0 in | Wt 233.0 lb

## 2020-03-07 DIAGNOSIS — N898 Other specified noninflammatory disorders of vagina: Secondary | ICD-10-CM | POA: Diagnosis not present

## 2020-03-07 NOTE — Progress Notes (Signed)
History:  34 y.o. [id number] here today for eval of vaginal odor and discharge. Pt has a h/o BV but, she had not had sx for over 6 months then she took meds but, shortly after had recurrent sx. She is also concerned about bumps in the vulva. She is worried that something chronic may be occurring.      The following portions of the patient's history were reviewed and updated as appropriate: allergies, current medications, past family history, past medical history, past social history, past surgical history and problem list.  Review of Systems:  Pertinent items are noted in HPI.    Objective:  Physical Exam Blood pressure 123/85, pulse 70, height 5\' 3"  (1.6 m), weight 233 lb (105.7 kg).  CONSTITUTIONAL: Well-developed, well-nourished female in no acute distress.  HENT:  Normocephalic, atraumatic EYES: Conjunctivae and EOM are normal. No scleral icterus.  NECK: Normal range of motion SKIN: Skin is warm and dry. No rash noted. Not diaphoretic.No pallor. NEUROLGIC: Alert and oriented to person, place, and time. Normal coordination.   Pelvic: Normal appearing external genitalia;Pt has some prominent hair follicles and as area of a sebaceous cyst there are not excoriations.  Normal appearing vaginal mucosa and cervix. IUD strings noted.  Normal discharge.     Assessment & Plan:  Vaginal odor and discharge- Affirm sent. D/w pt treatment for recurrent BV but, pt retrospectively only had 2 episodes this year. She was worried about  Herpes. She feels better knowing that is not present.   F/u Affirm  Total face-to-face time with patient was 17 min.  Greater than 50% was spent in counseling and coordination of care with the patient.   Iverson Sees L. Harraway-Smith, M.D., 

## 2020-03-07 NOTE — Progress Notes (Signed)
Recurrent BV- odor with discharge. Patient has noticed two bumps on vaginal area.  Armandina Stammer RN

## 2020-03-08 LAB — CERVICOVAGINAL ANCILLARY ONLY
Bacterial Vaginitis (gardnerella): NEGATIVE
Candida Glabrata: NEGATIVE
Candida Vaginitis: NEGATIVE
Comment: NEGATIVE
Comment: NEGATIVE
Comment: NEGATIVE

## 2020-07-26 ENCOUNTER — Encounter: Payer: Self-pay | Admitting: Family Medicine

## 2020-08-29 ENCOUNTER — Encounter: Payer: Self-pay | Admitting: Obstetrics & Gynecology

## 2020-08-29 ENCOUNTER — Ambulatory Visit (INDEPENDENT_AMBULATORY_CARE_PROVIDER_SITE_OTHER): Payer: BC Managed Care – PPO | Admitting: Obstetrics & Gynecology

## 2020-08-29 ENCOUNTER — Other Ambulatory Visit: Payer: Self-pay

## 2020-08-29 VITALS — BP 117/79 | HR 71 | Resp 16 | Ht 63.0 in | Wt 236.0 lb

## 2020-08-29 DIAGNOSIS — Z01419 Encounter for gynecological examination (general) (routine) without abnormal findings: Secondary | ICD-10-CM

## 2020-08-29 DIAGNOSIS — Z30431 Encounter for routine checking of intrauterine contraceptive device: Secondary | ICD-10-CM

## 2020-08-29 NOTE — Progress Notes (Signed)
Subjective:     Erica [name] is a 34 y.o. female here for a routine exam.  Current complaints: pt for routine GYN exam. No problems noted. Has a LnIUD. She still has monthly cycles that are very light.     Gynecologic History No LMP recorded. (Menstrual status: IUD). Contraception: IUD Last Pap: 07/27/2019. Results were: normal Last mammogram: n/a.   Obstetric History OB History  Gravida Para Term Preterm AB Living  3 3 3  0 0 3  SAB TAB Ectopic Multiple Live Births  0 0 0 0 3    # Outcome Date GA Lbr Len/2nd Weight Sex Delivery Anes PTL Lv  3 Term 09/17/11 [redacted]w[redacted]d 09:56 / 00:38 10 lb 1.4 oz (4.576 kg) M Vag-Spont EPI  LIV     Birth Comments: wnl  2 Term 03/31/09 [redacted]w[redacted]d  7 lb 13 oz (3.544 kg) F Vag-Spont  N LIV  1 Term 09/02/06 [redacted]w[redacted]d  7 lb 12 oz (3.515 kg) F Vag-Spont  N LIV   The following portions of the patient's history were reviewed and updated as appropriate: allergies, current medications, past family history, past medical history, past social history, past surgical history and problem list.  Review of Systems Pertinent items are noted in HPI.    Objective:  BP 117/79   Pulse 71   Resp 16   Ht 5\' 3"  (1.6 m)   Wt 236 lb (107 kg)   BMI 41.81 kg/m  General Appearance:    Alert, cooperative, no distress, appears stated age  Head:    Normocephalic, without obvious abnormality, atraumatic  Eyes:    conjunctiva/corneas clear, EOM's intact, both eyes  Ears:    Normal external ear canals, both ears  Nose:   Nares normal, septum midline, mucosa normal, no drainage    or sinus tenderness  Throat:   Lips, mucosa, and tongue normal; teeth and gums normal  Neck:   Supple, symmetrical, trachea midline, no adenopathy;    thyroid:  no enlargement/tenderness/nodules  Back:     Symmetric, no curvature, ROM normal, no CVA tenderness  Lungs:     respirations unlabored  Chest Wall:    No tenderness or deformity   Heart:    Regular rate and rhythm  Breast Exam:    No tenderness,  masses, or nipple abnormality  Abdomen:     Soft, non-tender, bowel sounds active all four quadrants,    no masses, no organomegaly  Genitalia:    Normal female without lesion, discharge or tenderness   Large cervix. IUD strings noted   Extremities:   Extremities normal, atraumatic, no cyanosis or edema  Pulses:   2+ and symmetric all extremities  Skin:   Skin color, texture, turgor normal, no rashes or lesions    Assessment:    Healthy female exam.   IUD check Cervical cancer screening-UTD. Next PAP due in 2023.    Plan:  F/u in 1 year or sooner prn  Pellegrino Kennard L. Harraway-Smith, M.D., 

## 2020-10-02 ENCOUNTER — Other Ambulatory Visit: Payer: Self-pay

## 2020-10-02 DIAGNOSIS — B9689 Other specified bacterial agents as the cause of diseases classified elsewhere: Secondary | ICD-10-CM

## 2020-10-02 MED ORDER — METRONIDAZOLE 500 MG PO TABS: 500.0000 mg | ORAL_TABLET | Freq: Two times a day (BID) | ORAL | 0 refills | Status: DC

## 2020-10-02 NOTE — Progress Notes (Signed)
Pt sent Mychart message requesting a Rx for Metronidazole. Pt states she is having vaginal discharge and odor. Flagyl 500 mg BID x 7 days was sent to pt's pharmacy.  Trust Leh l Harrison Zetina, CMA

## 2020-12-06 ENCOUNTER — Encounter: Payer: Self-pay | Admitting: Family Medicine

## 2020-12-11 NOTE — Progress Notes (Addendum)
    SUBJECTIVE:   CHIEF COMPLAINT / HPI: asthma follow up   Asthma, moderate persistent  Patient reports she has been experiencing increased asthma symtpoms for the last 4 months. She adds that she has been needing her albuterol inhaler more, using it up to 6 times per day. She is currenlty only using albuterol inhaler for medication and reports previously using advair diskus years ago that controlled her symptoms well.  She reports some nights that the wheezing wakes her up at night, 2-3 nights per week. 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 Sometimes still wheezing after inhaler that stops within an hour or two. When very SOB, she will feel some tightness in her chest. Rarely has cough  Denies recent illness at the time of increased symtpoms She has not needed to be hospitalized or had ED trips due to difficulty breathing   HM  Recommended Hep C screening, COVID vaccine booster. Patient would like to get her booster at work for J&J vaccine.   PERTINENT  PMH / PSH: Moderate persist asthma   OBJECTIVE:   BP 116/68   Pulse (!) 59   Ht 5\' 3"  (1.6 m)   Wt 236 lb 12.8 oz (107.4 kg)   SpO2 98%   BMI 41.95 kg/m   General: female appearing stated age in no acute distress Cardio: Normal S1 and S2, no S3 or S4. Rhythm is regular. No murmurs or rubs.  Bilateral radial pulses palpable Pulm: bilateral, occasional expiratory wheezing, no crackles,nodiminished breath sounds. Normal respiratory effort, stable on RA   ASSESSMENT/PLAN:   Asthma, moderate persistent  Moderate persistent asthma  - Prescribed Advair and instructed patient to inhale 2 puffs BID every day to prevent asthma exacerbations - Reviewed signs and symptoms or respiratory distress - f/u in February to review medication efficacy     Healthcare maintenance Up to date on covid vaccination with J&J vaccine  Patient prefers to have booster at work to continue with  J&J option  Will defer Hep C testing to Feb when patient would like to her annual physical    F/u in Feb 2022 for physical   Mar 2022, MD [facility]

## 2020-12-11 NOTE — Assessment & Plan Note (Addendum)
  Moderate persistent asthma  - Prescribed Advair and instructed patient to inhale 2 puffs BID every day to prevent asthma exacerbations - Reviewed signs and symptoms or respiratory distress - f/u in February to review medication efficacy

## 2020-12-12 ENCOUNTER — Encounter: Payer: Self-pay | Admitting: Family Medicine

## 2020-12-12 ENCOUNTER — Ambulatory Visit (INDEPENDENT_AMBULATORY_CARE_PROVIDER_SITE_OTHER): Payer: BC Managed Care – PPO | Admitting: Family Medicine

## 2020-12-12 ENCOUNTER — Other Ambulatory Visit: Payer: Self-pay

## 2020-12-12 DIAGNOSIS — Z0000 Encounter for general adult medical examination without abnormal findings: Secondary | ICD-10-CM | POA: Diagnosis not present

## 2020-12-12 DIAGNOSIS — J4540 Moderate persistent asthma, uncomplicated: Secondary | ICD-10-CM | POA: Diagnosis not present

## 2020-12-12 MED ORDER — ADVAIR HFA 45-21 MCG/ACT IN AERO: 2.0000 | INHALATION_SPRAY | Freq: Two times a day (BID) | RESPIRATORY_TRACT | 12 refills | Status: DC

## 2020-12-12 NOTE — Patient Instructions (Signed)
It was a pleasure to see you today!  Thank you for choosing [facility] for your primary care.  Erica [name] was seen for asthma follow-up.   Our plans for today were:  I prescribed an inhaler for you to use twice daily called earlier.  Please follow-up in 2 months to see how this inhaler is helping with your breathing.  If you notice that you continue to need your albuterol inhaler as a rescue therapy, please notify our office so that we can reassess you and change medication to better control your symptoms.  At your follow-up appointment, we can discuss if you would like to you undergo spirometry testing.  To keep you healthy, please keep in mind the following health maintenance items that you are due for:   1. Recommended she have your Covid booster when available 2. Recommend hepatitis C screening   You should return to our clinic in 2 months for asthma follow-up.   Best Wishes,   Dr. Neita Garnet    Fluticasone; Salmeterol inhalation aerosol What is this medicine? FLUTICASONE; SALMETEROL (floo TIK a sone; sal ME te role) inhalation is a combination of two medicines that decrease inflammation and help to open up the airways of your lungs. It is used to treat asthma. Do NOT use for an acute asthma attack. This medicine may be used for other purposes; ask your health care provider or pharmacist if you have questions. COMMON BRAND NAME(S): Advair HFA What should I tell my health care provider before I take this medicine? They need to know if you have any of these conditions:  bone problems  diabetes  eye disease, vision problems  immune system problems  heart disease or irregular heartbeat  high blood pressure  infection  pheochromocytoma  seizures  thyroid disease  worsening asthma  an unusual or allergic reaction to fluticasone; salmeterol, other corticosteroids, other medicines, foods, dyes, or preservatives  pregnant or trying to  get pregnant  breast-feeding How should I use this medicine? This medicine is inhaled through the mouth. Follow the directions on the prescription label. Shake well for 5 seconds before each use. After using the inhaler, rinse your mouth with water. Make sure not to swallow the water. Take your medicine at regular intervals. Do not take your medicine more often than directed. Do not stop taking except on your doctor's advice. Make sure that you are using your inhaler correctly. Ask you doctor or health care provider if you have any questions. A special MedGuide will be given to you by the pharmacist with each prescription and refill. Be sure to read this information carefully each time. Talk to your pediatrician regarding the use of this medicine in children. While this drug may be prescribed for children as young as 47 years of age for selected conditions, precautions do apply. Overdosage: If you think you have taken too much of this medicine contact a poison control center or emergency room at once. NOTE: This medicine is only for you. Do not share this medicine with others. What if I miss a dose? If you miss a dose, use it as soon as you remember. If it is almost time for your next dose, use only that dose and continue with your regular schedule, spacing doses evenly. Do not use double or extra doses. What may interact with this medicine? Do not take this medicine with any of the following medications:  MAOIs like Carbex, Eldepryl, Marplan, Nardil, and Parnate This medicine may also interact  with the following medications:  aminophylline or theophylline  antiviral medicines for HIV or AIDS  beta-blockers like metoprolol and propranolol  certain antibiotics like clarithromycin, erythromycin, levofloxacin, linezolid, and telithromycin  certain medicines for fungal infections like ketoconazole, itraconazole, posaconazole, voriconazole  conivaptan  diuretics  medicines for  colds  medicines for depression or emotional conditions  nefazodone  vaccines This list may not describe all possible interactions. Give your health care provider a list of all the medicines, herbs, non-prescription drugs, or dietary supplements you use. Also tell them if you smoke, drink alcohol, or use illegal drugs. Some items may interact with your medicine. What should I watch for while using this medicine? Visit your doctor for regular check ups. Tell your doctor or health care professional if your symptoms do not get better. Do not use this medicine more than every 12 hours. NEVER use this medicine for an acute asthma attack. You should use your short-acting rescue inhalers for this purpose. If your symptoms get worse or if you need your short-acting inhalers more often, call your doctor right away. This medicine may increase your risk of getting an infection. Tell your doctor or health care professional if you are around anyone with measles or chickenpox, or if you develop sores or blisters that do not heal properly. This medicine may increase blood sugar. Ask your healthcare provider if changes in diet or medicines are needed if you have diabetes. What side effects may I notice from receiving this medicine? Side effects that you should report to your doctor or health care professional as soon as possible:  allergic reactions like skin rash or hives, swelling of the face, lips, or tongue  breathing problems right after inhaling your medicine  chest pain  fast, irregular heartbeat  feeling faint or lightheaded, falls  fever or chills  nausea, vomiting  signs and symptoms of high blood sugar such as being more thirsty or hungry or having to urinate more than normal. You may also feel very tired or have blurry vision. Side effects that usually do not require medical attention (report to your doctor or health care professional if they continue or are  bothersome):  cough  headache  nervousness  sore throat  stuffy nose  tremor This list may not describe all possible side effects. Call your doctor for medical advice about side effects. You may report side effects to FDA at 1-800-FDA-1088. Where should I keep my medicine? Keep out of the reach of children. Store at room temperature between 15 and 30 degrees C (59 and 86 degrees F). Store inhaler with the mouthpiece down. Throw away the inhaler when the dose indicator reads 000, or after the expiration date, whichever comes first. NOTE: This sheet is a summary. It may not cover all possible information. If you have questions about this medicine, talk to your doctor, pharmacist, or health care provider.  2020 Elsevier/Gold Standard (2018-09-08 11:32:44)

## 2020-12-12 NOTE — Assessment & Plan Note (Signed)
Up to date on covid vaccination with J&J vaccine  Patient prefers to have booster at work to continue with J&J option  Will defer Hep C testing to Feb when patient would like to her annual physical

## 2021-01-06 ENCOUNTER — Encounter: Payer: Self-pay | Admitting: Family Medicine

## 2021-01-19 ENCOUNTER — Encounter: Payer: Self-pay | Admitting: Nurse Practitioner

## 2021-01-19 ENCOUNTER — Telehealth: Payer: BC Managed Care – PPO | Admitting: Nurse Practitioner

## 2021-01-19 ENCOUNTER — Encounter: Payer: Self-pay | Admitting: Family Medicine

## 2021-01-19 DIAGNOSIS — U071 COVID-19: Secondary | ICD-10-CM | POA: Diagnosis not present

## 2021-01-19 NOTE — Patient Instructions (Signed)

## 2021-01-19 NOTE — Progress Notes (Signed)
Virtual Visit Progress Note  Ms. Erica [name],you are scheduled for a virtual visit with your provider today.    Just as we do with appointments in the office, we must obtain your consent to participate.  Your consent will be active for this visit and any virtual visit you may have with one of our providers in the next 365 days.    If you have a MyChart account, I can also send a copy of this consent to you electronically.  All virtual visits are billed to your insurance company just like a traditional visit in the office.  As this is a virtual visit, video technology does not allow for your provider to perform a traditional examination.  This may limit your provider's ability to fully assess your condition.  If your provider identifies any concerns that need to be evaluated in person or the need to arrange testing such as labs, EKG, etc, we will make arrangements to do so.    Although advances in technology are sophisticated, we cannot ensure that it will always work on either your end or our end.  If the connection with a video visit is poor, we may have to switch to a telephone visit.  With either a video or telephone visit, we are not always able to ensure that we have a secure connection.   I need to obtain your verbal consent now.   Are you willing to proceed with your visit today?   Erica [name] has provided verbal consent on 01/19/2021 for a virtual visit (video or telephone).   Erica Dooms, NP 01/19/2021  4:14 PM    I connected with Erica [name] on 01/19/21 at  4:30 PM EST by video enabled telemedicine visit and verified that I am speaking with the correct person using two identifiers.   I discussed the limitations, risks, security and privacy concerns of performing an evaluation and management service by telemedicine and the availability of in-person appointments. I also discussed with the patient that there may be a patient responsible charge related to this service. The  patient expressed understanding and agreed to proceed.   Other persons participating in the visit and their role in the encounter: none  Patient's location: home Provider's location: home  Chief Complaint: Covid Positive    Patient Care Team: Erica Ramp, MD as PCP - General   Name of the patient: Erica [name]  [id number]  17-Nov-1986   Date of visit: 01/19/21  Chief complaint/ Reason for visit- Covid 19 Positive Home Test  History of Presenting Illness- Patient is 35 year old female who presents for complaints of covid-19 symptoms which started this morning. She took a home test which was positive. Symptoms: headache, joint pain, worse in hips, runny nose, congestion. She has sick contacts through work. She received J&J booster on December 31st 2021. No fevers, shortness of breath or chest pain. She is not able to check her oxygen levels. She is quarantining from the other members of her household and questions testing them and isolation guidelines. PMH of asthma, obesity. Nothing seems to make symptoms better or worse. She hasn't taken otc medications at this time.   Review of systems- Review of Systems  Constitutional: Positive for malaise/fatigue. Negative for chills, diaphoresis, fever and weight loss.  HENT: Positive for congestion. Negative for ear discharge, ear pain, nosebleeds, sinus pain and sore throat.   Eyes: Negative for pain and redness.  Respiratory: Negative for cough, hemoptysis, sputum production, shortness of breath,  wheezing and stridor.   Cardiovascular: Negative for chest pain, palpitations and leg swelling.  Gastrointestinal: Negative for abdominal pain, constipation, diarrhea, nausea and vomiting.  Musculoskeletal: Positive for joint pain. Negative for falls and myalgias.  Skin: Negative for rash.  Neurological: Negative for dizziness, loss of consciousness, weakness and headaches.  Psychiatric/Behavioral: Negative for depression. The patient is  nervous/anxious. The patient does not have insomnia.   All other systems reviewed and are negative.    No Known Allergies  Past Medical History:  Diagnosis Date  . Anemia   . Asthma   . Pregnancy induced hypertension   . Rh incompatibility   . Varicosities     No past surgical history on file.  Social History   Socioeconomic History  . Marital status: Married    Spouse name: Not on file  . Number of children: Not on file  . Years of education: Not on file  . Highest education level: Not on file  Occupational History  . Not on file  Tobacco Use  . Smoking status: Never Smoker  . Smokeless tobacco: Never Used  Vaping Use  . Vaping Use: Never used  Substance and Sexual Activity  . Alcohol use: No  . Drug use: No  . Sexual activity: Yes    Birth control/protection: I.U.D.  Other Topics Concern  . Not on file  Social History Narrative  . Not on file   Social Determinants of Health   Financial Resource Strain: Not on file  Food Insecurity: Not on file  Transportation Needs: Not on file  Physical Activity: Not on file  Stress: Not on file  Social Connections: Not on file  Intimate Partner Violence: Not on file    Immunization History  Administered Date(s) Administered  . Influenza Split 09/08/2011  . Influenza Whole 10/28/2010  . Influenza,inj,Quad PF,6+ Mos 07/30/2016  . Influenza-Unspecified 08/22/2014, 08/23/2015, 08/22/2017, 09/11/2018, 07/29/2019, 09/05/2020  . Janssen (J&J) SARS-COV-2 Vaccination 07/31/2020  . Rho (D) Immune Globulin 06/05/2011, 08/01/2011, 09/18/2011  . Td 10/26/2003  . Tdap 09/18/2011    Family History  Problem Relation Age of Onset  . Asthma Mother   . Hypertension Mother   . Hypertension Father   . Drug abuse Father   . Hepatitis Daughter      Current Outpatient Medications:  .  albuterol (VENTOLIN HFA) 108 (90 Base) MCG/ACT inhaler, INHALE ONE TO TWO PUFFS BY MOUTH EVERY 4 HOURS AS NEEDED FOR SHORTNESS OF BREATH, Disp:  18 g, Rfl: 1 .  fluticasone-salmeterol (ADVAIR HFA) 45-21 MCG/ACT inhaler, Inhale 2 puffs into the lungs 2 (two) times daily., Disp: 1 each, Rfl: 12 .  levonorgestrel (MIRENA) 20 MCG/24HR IUD, 1 each by Intrauterine route once., Disp: , Rfl:  .  metroNIDAZOLE (FLAGYL) 500 MG tablet, Take 1 tablet (500 mg total) by mouth 2 (two) times daily., Disp: 14 tablet, Rfl: 0  Physical exam: Exam limited due to telemedicine Physical Exam    Assessment and plan- Patient is a 35 y.o. female who presents for positive covid 19 antigen test. Discussed home test is consistent with positive results. Risk of false positive is very low and given her symptoms, likely represents true infection.   1) Covid-19 Virus Infection - I advised patient to stay well hydrated, particularly in the event of sustained or high fevers, in whom insensible fluid losses may be significant  - Cough that is persistent, interferes with sleep, or causes discomfort can be managed with an over-the-counter cough medication (eg, dextromethorphan) or prescription benzonatate,  100 to 200 mg orally three times daily as needed - I recommend rest as needed during the acute illness and frequent repositioning ambulation is encouraged. In addition, we encourage all patients to advance activity as soon as tolerated during recovery. - take mucinex to thin secretions - take tylenol and/or ibuprofen per label as needed for pain/fever/ body aches. Do not exceed 3000 mg in 24 hour period of tylenol - Reviewed symptoms that would warrant ER or hospital care - encouraged patient to monitor oxygen levels with pulse oximeter, reviewed troubleshooting, and to seek ER for SpO2 < 94% - CDC criteria for quarantine and isolation reviewed - In non-hospitalized patients, we do not treat COVID-19 with dexamethasone, prednisone, or other corticosteroids. Per the literature, extrapolating from the results of studies of hospitalized patients, there is no evidence that  corticosteroids benefit patients without a supplemental oxygen requirement, and further, they may be associated with poorer clinical outcomes - There is not high-quality data that suggests that supplementation with vitamin C, vitamin D, or zinc reduces the severity of COVID-19 in non-hospitalized patients - I do recommend breathing exercises though they haven't been studied in patients with covid-19.  - Some patients with cough or dyspnea may experience symptomatic improvement with self-proning (resting in the prone rather than the supine position) - For patients with documented COVID-19, we do not routinely administer empiric therapy for bacterial pneumonia. Data are limited, but bacterial superinfection does not appear to be a prominent feature of COVID-19  I have referred you to the Covid Treatment team for consideration of oral antiviral medications. If symptoms do not improve or worsen over the next few days please call us back or follow up with your PCP.    Visit Diagnosis 1. COVID-19 virus infection     Patient expressed understanding and was in agreement with this plan. She also understands that She can call clinic at any time with any questions, concerns, or complaints.   I discussed the assessment and treatment plan with the patient. The patient was provided an opportunity to ask questions and all were answered. The patient agreed with the plan and demonstrated an understanding of the instructions.   The patient was advised to call back or seek an in-person evaluation if the symptoms worsen or if the condition fails to improve as anticipated.   I provided 20 minutes of face-to-face video visit time dedicated to the care of this patient on the date of this encounter to include pre-visit review of past medical history, labs, and medication review, face-to-face time with the patient, and post visit ordering of testing/documentation.   Thank you for allowing me to participate in the care of  this very pleasant patient.   Consuello Masse, DNP, AGNP-C [facility]  CC: Dr. Neita Garnet (PCP)

## 2021-01-20 ENCOUNTER — Other Ambulatory Visit: Payer: Self-pay | Admitting: Adult Health

## 2021-01-20 ENCOUNTER — Encounter: Payer: Self-pay | Admitting: Adult Health

## 2021-01-20 DIAGNOSIS — U071 COVID-19: Secondary | ICD-10-CM

## 2021-01-20 NOTE — Progress Notes (Signed)
Outpatient Oral COVID Treatment Note  I connected with Erica [name] on 01/20/2021/4:44 PM by telephone and verified that I am speaking with the correct person using two identifiers.  I discussed the limitations, risks, security, and privacy concerns of performing an evaluation and management service by telephone and the availability of in person appointments. I also discussed with the patient that there may be a patient responsible charge related to this service. The patient expressed understanding and agreed to proceed.  Patient location: home Provider location: home  Diagnosis: COVID-19 infection  Purpose of visit: Discussion of potential use of Molnupiravir or Paxlovid, a new treatment for mild to moderate COVID-19 viral infection in non-hospitalized patients.   Subjective: Patient is a 35 y.o. female who has been diagnosed with COVID 19 viral infection.  Their symptoms began on 01/18/2021 with cough, and sore throat.    Past Medical History:  Diagnosis Date  . Anemia   . Asthma   . Pregnancy induced hypertension   . Rh incompatibility   . Varicosities     No Known Allergies   Current Outpatient Medications:  .  albuterol (VENTOLIN HFA) 108 (90 Base) MCG/ACT inhaler, INHALE ONE TO TWO PUFFS BY MOUTH EVERY 4 HOURS AS NEEDED FOR SHORTNESS OF BREATH, Disp: 18 g, Rfl: 1 .  fluticasone-salmeterol (ADVAIR HFA) 45-21 MCG/ACT inhaler, Inhale 2 puffs into the lungs 2 (two) times daily., Disp: 1 each, Rfl: 12 .  levonorgestrel (MIRENA) 20 MCG/24HR IUD, 1 each by Intrauterine route once., Disp: , Rfl:   Objective: Patient appears/sounds moderately well.  They are in no apparent distress.  Breathing is non labored.  Mood and behavior are normal.  Laboratory Data:  No results found for this or any previous visit (from the past 2160 hour(s)).   Assessment: 35 y.o. female with mild/moderate COVID 19 viral infection diagnosed on 01/19/2021 via home test at high risk for progression to  severe COVID 19.  Plan:  This patient is a 35 y.o. female that meets the following criteria for Emergency Use Authorization of: Paxlovid 1. Age >12 yr AND > 40 kg 2. SARS-COV-2 positive test 3. Symptom onset < 5 days 4. Mild-to-moderate COVID disease with high risk for severe progression to hospitalization or death  I have spoken and communicated the following to the patient or parent/caregiver regarding: 1. Paxlovid is an unapproved drug that is authorized for use under an Emergency Use Authorization.  2. There are no adequate, approved, available products for the treatment of COVID-19 in adults who have mild-to-moderate COVID-19 and are at high risk for progressing to severe COVID-19, including hospitalization or death. 3. Other therapeutics are currently authorized. For additional information on all products authorized for treatment or prevention of COVID-19, please see [email or web address]://[email or web address]-[email or web address]  4. There are benefits and risks of taking this treatment as outlined in the "Fact Sheet for Patients and Caregivers."  5. "Fact Sheet for Patients and Caregivers" was reviewed with patient. A hard copy will be provided to patient from pharmacy prior to the patient receiving treatment. 6. Patients should continue to self-isolate and use infection control measures (e.g., wear mask, isolate, social distance, avoid sharing personal items, clean and disinfect "high touch" surfaces, and frequent handwashing) according to CDC guidelines.  7. The patient or parent/caregiver has the option to accept or refuse treatment. 8. Patient medication history was reviewed for potential drug interactions:Interaction with home meds: Advair inhaler, will hold and use albuterol PRN 9. Patient's creatinine clearance is pending, will prescribe once results are  back and send in script/addend note.   After reviewing above  information with the patient, the patient agrees to receive Paxlovid.  Follow up instructions:    . Take prescription BID x 5 days as directed . Reach out to pharmacist for counseling on medication if desired . For concerns regarding further COVID symptoms please follow up with your PCP or urgent care . For urgent or life-threatening issues, seek care at your local emergency department  The patient was provided an opportunity to ask questions, and all were answered. The patient agreed with the plan and demonstrated an understanding of the instructions.   Script to be sent to [facility] and opted to pick up after undergoing lab testing.    The patient was advised to call their PCP or seek an in-person evaluation if the symptoms worsen or if the condition fails to improve as anticipated.   I provided 15 minutes of non face-to-face telephone visit time during this encounter, and > 50% was spent counseling as documented under my assessment & plan.  Noreene Filbert, NP 01/20/2021 /4:44 PM

## 2021-01-20 NOTE — Addendum Note (Signed)
Addended by: Alinda Dooms on: 01/20/2021 06:55 PM   Modules accepted: Level of Service

## 2021-01-21 ENCOUNTER — Telehealth (HOSPITAL_COMMUNITY): Payer: Self-pay

## 2021-01-21 ENCOUNTER — Other Ambulatory Visit: Payer: Self-pay | Admitting: Physician Assistant

## 2021-01-21 ENCOUNTER — Ambulatory Visit (HOSPITAL_COMMUNITY)
Admission: RE | Admit: 2021-01-21 | Discharge: 2021-01-21 | Disposition: A | Discharge status: Home / Self Care | Payer: BC Managed Care – PPO | Source: Ambulatory Visit | Attending: Pulmonary Disease | Admitting: Pulmonary Disease

## 2021-01-21 DIAGNOSIS — U071 COVID-19: Secondary | ICD-10-CM | POA: Insufficient documentation

## 2021-01-21 LAB — BASIC METABOLIC PANEL
Anion gap: 10 (ref 5–15)
BUN: 16 mg/dL (ref 6–20)
CO2: 26 mmol/L (ref 22–32)
Calcium: 8.7 mg/dL — ABNORMAL LOW (ref 8.9–10.3)
Chloride: 102 mmol/L (ref 98–111)
Creatinine, Ser: 0.63 mg/dL (ref 0.44–1.00)
GFR, Estimated: >60 mL/min (ref >60)
Glucose, Bld: 101 mg/dL — ABNORMAL HIGH (ref 70–99)
Potassium: 3.7 mmol/L (ref 3.5–5.1)
Sodium: 138 mmol/L (ref 135–145)

## 2021-01-21 MED ORDER — NIRMATRELVIR/RITONAVIR (PAXLOVID)TABLET: 3.0000 | ORAL_TABLET | Freq: Two times a day (BID) | ORAL | 0 refills | Status: AC

## 2021-01-21 MED FILL — PAXLOVID 20 X 150 MG & 10 X: 20 X 150 MG | 5 days supply | Qty: 30 | Fill #0

## 2021-01-21 NOTE — Progress Notes (Signed)
Patient presented today,labs were drawn and sent to the lab. Patient given education that once results were available the provider would be notified.

## 2021-01-21 NOTE — Telephone Encounter (Signed)
Patient was prescribed oral covid treatment paxlovid and treatment note was reviewed. Medication has been received by [name] and reviewed for appropriateness.  Drug Interactions or Dosage Adjustments Noted:hold advair until 24 hours after last paxlovid dose  Delivery Method: pt pick up  Patient contacted for counseling on 01/21/2021 and verbalized understanding.   Delivery or Pick-Up Date: 01/21/2021   Erica [name] 01/21/2021, 11:57 AM [facility] Pharmacist Phone# [phone]

## 2021-03-13 ENCOUNTER — Other Ambulatory Visit: Payer: Self-pay | Admitting: *Deleted

## 2021-03-13 MED ORDER — ALBUTEROL SULFATE HFA 108 (90 BASE) MCG/ACT IN AERS: INHALATION_SPRAY | RESPIRATORY_TRACT | 1 refills | Status: DC

## 2021-03-24 NOTE — Progress Notes (Signed)
    SUBJECTIVE:   CHIEF COMPLAINT / HPI: f/u for asthma   Moderate Persistent Asthma  Patient presenting to follow up for asthma. One month ago, she was started on Advair HFA and since reports experiencing  improvement in her symptoms when she uses the inhaler daily.   Paitent has required rescue albuterol max of twice times per week emphasizing that this was during days without using her advair. She reports no nighttime cough or dyspnea, denies chest tightness or SOB with exertion. Patient requesting to use diskus form as it has less cost.    PERTINENT  PMH / PSH:  Moderate Persistent Asthma   OBJECTIVE:   BP 118/72   Pulse 71   Wt 239 lb 3.2 oz (108.5 kg)   SpO2 99%   BMI 42.37 kg/m   General: female appearing stated age in no acute distress Cardio: Normal S1 and S2, no S3 or S4. Rhythm is regular. No murmurs or rubs.  Bilateral radial pulses palpable Pulm: Clear to auscultation bilaterally, no crackles, wheezing, or diminished breath sounds. Normal respiratory effort, stable on RA Abdomen: Bowel sounds normal. Abdomen soft and non-tender.  Extremities: No peripheral edema. Warm/ well perfused.   ASSESSMENT/PLAN:   Asthma, moderate persistent Improved symptoms on controller medication, Advair.  Rx refills with diskus form  Patient to follow up for annual physical in May    F/U as scheduled   Ronnald Ramp, MD [facility]

## 2021-03-24 NOTE — Patient Instructions (Signed)
It was a pleasure to see you today!  Thank you for choosing [facility] for your primary care.    Erica [name] was seen for asthma follow up.   Our plans for today were:  Asthma : High have changed medications of the form that you requested.  Please continue to use this daily as prescribed to help control your symptoms    You should return to our clinic as needed Asthma, Adult  Asthma is a long-term (chronic) condition in which the airways get tight and narrow. The airways are the breathing passages that lead from the nose and mouth down into the lungs. A person with asthma will have times when symptoms get worse. These are called asthma attacks. They can cause coughing, whistling sounds when you breathe (wheezing), shortness of breath, and chest pain. They can make it hard to breathe. There is no cure for asthma, but medicines and lifestyle changes can help control it. There are many things that can bring on an asthma attack or make asthma symptoms worse (triggers). Common triggers include:  Mold.  Dust.  Cigarette smoke.  Cockroaches.  Things that can cause allergy symptoms (allergens). These include animal skin flakes (dander) and pollen from trees or grass.  Things that pollute the air. These may include household cleaners, wood smoke, smog, or chemical odors.  Cold air, weather changes, and wind.  Crying or laughing hard.  Stress.  Certain medicines or drugs.  Certain foods such as dried fruit, potato chips, and grape juice.  Infections, such as a cold or the flu.  Certain medical conditions or diseases.  Exercise or tiring activities. Asthma may be treated with medicines and by staying away from the things that cause asthma attacks. Types of medicines may include:  Controller medicines. These help prevent asthma symptoms. They are usually taken every day.  Fast-acting reliever or rescue medicines. These quickly relieve asthma symptoms. They are  used as needed and provide short-term relief.  Allergy medicines if your attacks are brought on by allergens.  Medicines to help control the body's defense (immune) system. Follow these instructions at home: Avoiding triggers in your home  Change your heating and air conditioning filter often.  Limit your use of fireplaces and wood stoves.  Get rid of pests (such as roaches and mice) and their droppings.  Throw away plants if you see mold on them.  Clean your floors. Dust regularly. Use cleaning products that do not smell.  Have someone vacuum when you are not home. Use a vacuum cleaner with a HEPA filter if possible.  Replace carpet with wood, tile, or vinyl flooring. Carpet can trap animal skin flakes and dust.  Use allergy-proof pillows, mattress covers, and box spring covers.  Wash bed sheets and blankets every week in hot water. Dry them in a dryer.  Keep your bedroom free of any triggers.  Avoid pets and keep windows closed when things that cause allergy symptoms are in the air.  Use blankets that are made of polyester or cotton.  Clean bathrooms and kitchens with bleach. If possible, have someone repaint the walls in these rooms with mold-resistant paint. Keep out of the rooms that are being cleaned and painted.  Wash your hands often with soap and water. If soap and water are not available, use hand sanitizer.  Do not allow anyone to smoke in your home. General instructions  Take over-the-counter and prescription medicines only as told by your doctor. ? Talk with your doctor  if you have questions about how or when to take your medicines. ? Make note if you need to use your medicines more often than usual.  Do not use any products that contain nicotine or tobacco, such as cigarettes and e-cigarettes. If you need help quitting, ask your doctor.  Stay away from secondhand smoke.  Avoid doing things outdoors when allergen counts are high and when air quality is  low.  Wear a ski mask when doing outdoor activities in the winter. The mask should cover your nose and mouth. Exercise indoors on cold days if you can.  Warm up before you exercise. Take time to cool down after exercise.  Use a peak flow meter as told by your doctor. A peak flow meter is a tool that measures how well the lungs are working.  Keep track of the peak flow meter's readings. Write them down.  Follow your asthma action plan. This is a written plan for taking care of your asthma and treating your attacks.  Make sure you get all the shots (vaccines) that your doctor recommends. Ask your doctor about a flu shot and a pneumonia shot.  Keep all follow-up visits as told by your doctor. This is important. Contact a doctor if:  You have wheezing, shortness of breath, or a cough even while taking medicine to prevent attacks.  The mucus you cough up (sputum) is thicker than usual.  The mucus you cough up changes from clear or white to yellow, green, gray, or bloody.  You have problems from the medicine you are taking, such as: ? A rash. ? Itching. ? Swelling. ? Trouble breathing.  You need reliever medicines more than 2-3 times a week.  Your peak flow reading is still at 50-79% of your personal best after following the action plan for 1 hour.  You have a fever. Get help right away if:  You seem to be worse and are not responding to medicine during an asthma attack.  You are short of breath even at rest.  You get short of breath when doing very little activity.  You have trouble eating, drinking, or talking.  You have chest pain or tightness.  You have a fast heartbeat.  Your lips or fingernails start to turn blue.  You are light-headed or dizzy, or you faint.  Your peak flow is less than 50% of your personal best.  You feel too tired to breathe normally. Summary  Asthma is a long-term (chronic) condition in which the airways get tight and narrow. An asthma  attack can make it hard to breathe.  Asthma cannot be cured, but medicines and lifestyle changes can help control it.  Make sure you understand how to avoid triggers and how and when to use your medicines. This information is not intended to replace advice given to you by your health care provider. Make sure you discuss any questions you have with your health care provider. Document Revised: 04/11/2020 Document Reviewed: 04/11/2020 Elsevier Patient Education  2021 [employer]. .   Best Wishes,   Dr. Neita Garnet

## 2021-03-25 ENCOUNTER — Encounter: Payer: Self-pay | Admitting: Family Medicine

## 2021-03-25 ENCOUNTER — Ambulatory Visit: Payer: BC Managed Care – PPO | Admitting: Family Medicine

## 2021-03-25 ENCOUNTER — Other Ambulatory Visit: Payer: Self-pay

## 2021-03-25 DIAGNOSIS — J4540 Moderate persistent asthma, uncomplicated: Secondary | ICD-10-CM

## 2021-03-25 MED ORDER — FLUTICASONE-SALMETEROL 500-50 MCG/DOSE IN AEPB: 1.0000 | INHALATION_SPRAY | RESPIRATORY_TRACT | 2 refills | Status: DC

## 2021-03-28 NOTE — Assessment & Plan Note (Signed)
Improved symptoms on controller medication, Advair.  Rx refills with diskus form  Patient to follow up for annual physical in May

## 2021-05-01 NOTE — Progress Notes (Signed)
    SUBJECTIVE:   CHIEF COMPLAINT / HPI: physical & right hip pain and right axillary boil   HM Patient recommended for hep c screening.  Last pap smear in 2020, normal results. Will not need a repeat until 2025.   Right flank/back Pain  Only happens at night if she sleeps on her left side  Pain is on the right side of her back  Flank and into her back  Goes away when she gets up to go urinate  She has to sleep on her right side or the pain will increase  Has been present for a few years   Right arm boil  Reports this started ~1 month ago and will intermittently improve and then reswell. She reports that she often gets these and they will rupture sponta Tried using difffernt deordorant  Reports it is soft but painful and swollen  Not draining fluid   PERTINENT  PMH / PSH:  Asthma  OBJECTIVE:   BP 124/82   Pulse 95   Ht 5\' 3"  (1.6 m)   Wt 240 lb 12.8 oz (109.2 kg)   SpO2 97%   BMI 42.66 kg/m   General: female appearing stated age in no acute distress HEENT: MMM, no oral lesions noted,Neck non-tender without lymphadenopathy, masses or thyromegaly, no scleral icterus Cardio: Normal S1 and S2, no S3 or S4. Rhythm is regular. No murmurs or rubs.  Bilateral radial pulses palpable Pulm: Clear to auscultation bilaterally, no crackles, wheezing, or diminished breath sounds. Normal respiratory effort, stable on room air Abdomen: Bowel sounds normal. Abdomen soft and non-tender. Extremities: No peripheral edema. Warm/ well perfused. Skin: Right axillary region with erythematous, indurated measuring approximately with 3-4 in diameter, tender to palpation, no purulent drainage   ASSESSMENT/PLAN:   Well adult on routine health check Agreeable to Hep C screening today   Chronic right flank pain Considering gallbladder pathology, MSK, renal etiology.  U/A  CMP  Hep C screening    Hidradenitis axillaris Prescribed 7 day course of keflex      ,  MD [facility]

## 2021-05-02 ENCOUNTER — Other Ambulatory Visit: Payer: Self-pay

## 2021-05-02 ENCOUNTER — Encounter: Payer: Self-pay | Admitting: Family Medicine

## 2021-05-02 ENCOUNTER — Ambulatory Visit (INDEPENDENT_AMBULATORY_CARE_PROVIDER_SITE_OTHER): Payer: BC Managed Care – PPO | Admitting: Family Medicine

## 2021-05-02 VITALS — BP 124/82 | HR 95 | Ht 63.0 in | Wt 240.8 lb

## 2021-05-02 DIAGNOSIS — Z1159 Encounter for screening for other viral diseases: Secondary | ICD-10-CM | POA: Diagnosis not present

## 2021-05-02 DIAGNOSIS — Z0000 Encounter for general adult medical examination without abnormal findings: Secondary | ICD-10-CM | POA: Diagnosis not present

## 2021-05-02 DIAGNOSIS — R109 Unspecified abdominal pain: Secondary | ICD-10-CM

## 2021-05-02 DIAGNOSIS — G8929 Other chronic pain: Secondary | ICD-10-CM | POA: Insufficient documentation

## 2021-05-02 DIAGNOSIS — L732 Hidradenitis suppurativa: Secondary | ICD-10-CM

## 2021-05-02 LAB — POCT URINALYSIS DIP (MANUAL ENTRY)
Bilirubin, UA: NEGATIVE
Blood, UA: NEGATIVE
Glucose, UA: NEGATIVE mg/dL
Nitrite, UA: NEGATIVE
Protein Ur, POC: NEGATIVE mg/dL
Spec Grav, UA: 1.025 (ref 1.010–1.025)
Urobilinogen, UA: 1 E.U./dL
pH, UA: 6 (ref 5.0–8.0)

## 2021-05-02 LAB — POCT UA - MICROSCOPIC ONLY

## 2021-05-02 MED ORDER — CEPHALEXIN 500 MG PO CAPS: 500.0000 mg | ORAL_CAPSULE | Freq: Three times a day (TID) | ORAL | 0 refills | Status: AC

## 2021-05-02 NOTE — Assessment & Plan Note (Signed)
Agreeable to Hep C screening today

## 2021-05-02 NOTE — Assessment & Plan Note (Addendum)
Prescribed 7 day course of keflex

## 2021-05-02 NOTE — Assessment & Plan Note (Signed)
Considering gallbladder pathology, MSK, renal etiology.  U/A  CMP  Hep C screening

## 2021-05-02 NOTE — Patient Instructions (Signed)
Thank you for choosing [facility] for your health care!  For your right-sided pain, we will collect blood work and urine studies to check your liver, gallbladder as well as your kidney function.  I will contact you with health if there are any abnormal results.  I recommended she follow-up with me in the next 2-3 weeks to discuss this pain further.  I have also ordered for hepatitis C screening per health care recommendations for your age group.  For your skin changes on your arm, I will prescribe an antibiotic called Keflex.  Please take this for the next 7 days.  If the area becomes more red, swollen or painful, please notify our office so you can be evaluated sooner as you may need an incision and drainage of the area.   Hidradenitis Suppurativa Hidradenitis suppurativa is a long-term (chronic) skin disease. It is similar to a severe form of acne, but it affects areas of the body where acne would be unusual, especially areas of the body where skin rubs against skin and becomes moist. These include:  Underarms.  Groin.  Genital area.  Buttocks.  Upper thighs.  Breasts. Hidradenitis suppurativa may start out as small lumps or pimples caused by blocked sweat glands or hair follicles. Pimples may develop into deep sores that break open (rupture) and drain pus. Over time, affected areas of skin may thicken and become scarred. This condition is rare and does not spread from person to person (non-contagious). What are the causes? The exact cause of this condition is not known. It may be related to:  Female and female hormones.  An overactive disease-fighting system (immune system). The immune system may over-react to blocked hair follicles or sweat glands and cause swelling and pus-filled sores. What increases the risk? You are more likely to develop this condition if you:  Are female.  Are 72-43 years old.  Have a family history of hidradenitis suppurativa.  Have a  personal history of acne.  Are overweight.  Smoke.  Take the medicine lithium. What are the signs or symptoms? The first symptoms are usually painful bumps in the skin, similar to pimples. The condition may get worse over time (progress), or it may only cause mild symptoms. If the disease progresses, symptoms may include:  Skin bumps getting bigger and growing deeper into the skin.  Bumps rupturing and draining pus.  Itchy, infected skin.  Skin getting thicker and scarred.  Tunnels under the skin (fistulas) where pus drains from a bump.  Pain during daily activities, such as pain during walking if your groin area is affected.  Emotional problems, such as stress or depression. This condition may affect your appearance and your ability or willingness to wear certain clothes or do certain activities. How is this diagnosed? This condition is diagnosed by a health care provider who specializes in skin diseases (dermatologist). You may be diagnosed based on:  Your symptoms and medical history.  A physical exam.  Testing a pus sample for infection.  Blood tests. How is this treated? Your treatment will depend on how severe your symptoms are. The same treatment will not work for everybody with this condition. You may need to try several treatments to find what works best for you. Treatment may include:  Cleaning and bandaging (dressing) your wounds as needed.  Lifestyle changes, such as new skin care routines.  Taking medicines, such as: ? Antibiotics. ? Acne medicines. ? Medicines to reduce the activity of the immune system. ? A  diabetes medicine (metformin). ? Birth control pills, for women. ? Steroids to reduce swelling and pain.  Working with a mental health care provider, if you experience emotional distress due to this condition. If you have severe symptoms that do not get better with medicine, you may need surgery. Surgery may involve:  Using a laser to clear the  skin and remove hair follicles.  Opening and draining deep sores.  Removing the areas of skin that are diseased and scarred. Follow these instructions at home: Medicines  Take over-the-counter and prescription medicines only as told by your health care provider.  If you were prescribed an antibiotic medicine, take it as told by your health care provider. Do not stop taking the antibiotic even if your condition improves.   Skin care  If you have open wounds, cover them with a clean dressing as told by your health care provider. Keep wounds clean by washing them gently with soap and water when you bathe.  Do not shave the areas where you get hidradenitis suppurativa.  Do not wear deodorant.  Wear loose-fitting clothes.  Try to avoid getting overheated or sweaty. If you get sweaty or wet, change into clean, dry clothes as soon as you can.  To help relieve pain and itchiness, cover sore areas with a warm, clean washcloth (warm compress) for 5-10 minutes as often as needed.  If told by your health care provider, take a bleach bath twice a week: ? Fill your bathtub halfway with water. ? Pour in  cup of unscented household bleach. ? Soak in the tub for 5-10 minutes. ? Only soak from the neck down. Avoid water on your face and hair. ? Shower to rinse off the bleach from your skin. General instructions  Learn as much as you can about your disease so that you have an active role in your treatment. Work closely with your health care provider to find treatments that work for you.  If you are overweight, work with your health care provider to lose weight as recommended.  Do not use any products that contain nicotine or tobacco, such as cigarettes and e-cigarettes. If you need help quitting, ask your health care provider.  If you struggle with living with this condition, talk with your health care provider or work with a mental health care provider as recommended.  Keep all follow-up  visits as told by your health care provider. This is important. Where to find more information  Hidradenitis Suppurativa Foundation, Inc.: https://www.hs-foundation.org/  American Academy of Dermatology: [employer] Contact a health care provider if you have:  A flare-up of hidradenitis suppurativa.  A fever or chills.  Trouble controlling your symptoms at home.  Trouble doing your daily activities because of your symptoms.  Trouble dealing with emotional problems related to your condition. Summary  Hidradenitis suppurativa is a long-term (chronic) skin disease. It is similar to a severe form of acne, but it affects areas of the body where acne would be unusual.  The first symptoms are usually painful bumps in the skin, similar to pimples. The condition may only cause mild symptoms, or it may get worse over time (progress).  If you have open wounds, cover them with a clean dressing as told by your health care provider. Keep wounds clean by washing them gently with soap and water when you bathe.  Besides skin care, treatment may include medicines, laser treatment, and surgery. This information is not intended to replace advice given to you by your health care  provider. Make sure you discuss any questions you have with your health care provider. Document Revised: 10/02/2020 Document Reviewed: 10/02/2020 Elsevier Patient Education  2021 [employer].

## 2021-05-03 LAB — COMPREHENSIVE METABOLIC PANEL
ALT: 14 IU/L (ref 0–32)
AST: 10 IU/L (ref 0–40)
Albumin/Globulin Ratio: 1.3 (ref 1.2–2.2)
Albumin: 3.9 g/dL (ref 3.8–4.8)
Alkaline Phosphatase: 81 IU/L (ref 44–121)
BUN/Creatinine Ratio: 15 (ref 9–23)
BUN: 11 mg/dL (ref 6–20)
Bilirubin Total: 0.2 mg/dL (ref 0.0–1.2)
CO2: 24 mmol/L (ref 20–29)
Calcium: 8.4 mg/dL — ABNORMAL LOW (ref 8.7–10.2)
Chloride: 103 mmol/L (ref 96–106)
Creatinine, Ser: 0.75 mg/dL (ref 0.57–1.00)
Globulin, Total: 2.9 g/dL (ref 1.5–4.5)
Glucose: 67 mg/dL (ref 65–99)
Potassium: 3.4 mmol/L — ABNORMAL LOW (ref 3.5–5.2)
Sodium: 141 mmol/L (ref 134–144)
Total Protein: 6.8 g/dL (ref 6.0–8.5)
eGFR: 106 mL/min/{1.73_m2} (ref >59)

## 2021-05-03 LAB — HEPATITIS C ANTIBODY: Hep C Virus Ab: <0.1 s/co ratio (ref 0.0–0.9)

## 2021-05-21 ENCOUNTER — Other Ambulatory Visit: Payer: Self-pay | Admitting: Advanced Practice Midwife

## 2021-05-21 DIAGNOSIS — B9689 Other specified bacterial agents as the cause of diseases classified elsewhere: Secondary | ICD-10-CM

## 2021-05-22 ENCOUNTER — Other Ambulatory Visit: Payer: Self-pay

## 2021-05-22 MED ORDER — METRONIDAZOLE 500 MG PO TABS: 500.0000 mg | ORAL_TABLET | Freq: Two times a day (BID) | ORAL | 0 refills | Status: AC

## 2021-06-12 ENCOUNTER — Other Ambulatory Visit: Payer: Self-pay

## 2021-06-12 ENCOUNTER — Encounter (HOSPITAL_COMMUNITY): Payer: Self-pay

## 2021-06-12 ENCOUNTER — Ambulatory Visit (HOSPITAL_COMMUNITY)
Admission: EM | Admit: 2021-06-12 | Discharge: 2021-06-12 | Disposition: A | Discharge status: Home / Self Care | Payer: BC Managed Care – PPO | Attending: Student | Admitting: Student

## 2021-06-12 DIAGNOSIS — Z20818 Contact with and (suspected) exposure to other bacterial communicable diseases: Secondary | ICD-10-CM | POA: Diagnosis not present

## 2021-06-12 DIAGNOSIS — J020 Streptococcal pharyngitis: Secondary | ICD-10-CM

## 2021-06-12 HISTORY — DX: Other allergy status, other than to drugs and biological substances: Z91.09

## 2021-06-12 LAB — POCT RAPID STREP A, ED / UC: Streptococcus, Group A Screen (Direct): NEGATIVE

## 2021-06-12 MED ORDER — AMOXICILLIN-POT CLAVULANATE 875-125 MG PO TABS: 1.0000 | ORAL_TABLET | Freq: Two times a day (BID) | ORAL | 0 refills | Status: DC

## 2021-06-12 MED ORDER — ACETAMINOPHEN 325 MG PO TABS
650.0000 mg | ORAL_TABLET | Freq: Once | ORAL | Status: AC
  Administered 2021-06-12: 650 mg via ORAL

## 2021-06-12 MED ORDER — ACETAMINOPHEN 325 MG PO TABS
ORAL_TABLET | ORAL | Status: AC
Start: 2021-06-12 — End: ?
  Filled 2021-06-12: qty 2

## 2021-06-12 NOTE — Discharge Instructions (Addendum)
-  Start the antibiotic-Augmentin (amoxicillin-clavulanate), 1 pill every 12 hours for 7 days.  You can take this with food like with breakfast and dinner. -Take Tylenol 1000 mg 3 times daily, and ibuprofen 800 mg 3 times daily with food.  You can take these together, or alternate every 3-4 hours. -Seek additional medical attention if symptoms get worse spite treatment, like trouble swallowing, shortness of breath, dizziness. -strep is still contagious for 24 hours after starting the antibiotic

## 2021-06-12 NOTE — ED Triage Notes (Signed)
Pt c/o sore throat starting yesterday, with pain on right side of neck to touch. States her daughter has strep throat.

## 2021-06-12 NOTE — ED Provider Notes (Signed)
[facility]    CSN: [id number] Arrival date & time: 06/12/21  0263      History   Chief Complaint Chief Complaint  Patient presents with   Sore Throat    HPI Erica [name] is a 35 y.o. female presenting with sore throat following exposure to strep pharyngitis.  Medical history asthma, hidradenitis.  Endorses 1 day of sore throat with subjective chills following exposure to daughter who has strep throat.  Has not taken any medications for her symptoms. Denies fevers/chills, n/v/d, shortness of breath, chest pain, cough, congestion, facial pain, teeth pain, headaches, loss of taste/smell, swollen lymph nodes, ear pain.    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 No pertinent surgical history.  OB History     Gravida  3   Para  3   Term  3   Preterm  0   AB  0   Living  3      SAB  0   IAB  0   Ectopic  0   Multiple  0   Live Births  3            Home Medications    Prior to Admission medications   Medication Sig Start Date End Date Taking? Authorizing Provider  albuterol (VENTOLIN HFA) 108 (90 Base) MCG/ACT inhaler INHALE ONE TO TWO PUFFS BY MOUTH EVERY 4 HOURS AS NEEDED FOR SHORTNESS OF BREATH 03/13/21  Yes Simmons-Robinson, Makiera, MD  amoxicillin-clavulanate (AUGMENTIN) 875-125 MG tablet Take 1 tablet by mouth every 12 (twelve) hours. 06/12/21  Yes Rhys Martini, PA-C  Fluticasone-Salmeterol (ADVAIR DISKUS) 500-50 MCG/DOSE AEPB Inhale 1 puff into the lungs 1 day or 1  dose for 240 doses. 03/25/21 11/20/21 Yes Simmons-Robinson, Makiera, MD  levonorgestrel (MIRENA) 20 MCG/24HR IUD 1 each by Intrauterine route once.   Yes [provider]    Family History Family History  Problem Relation Age of Onset   Asthma Mother    Hypertension Mother    Hypertension Father    Drug abuse Father    Hepatitis Daughter     Social History Social History   Tobacco Use   Smoking status: Never   Smokeless tobacco: Never  Vaping Use   Vaping Use: Never used  Substance Use Topics   Alcohol use: No   Drug use: No     Allergies   Cashew nut oil and Pistachio nut (diagnostic)   Review of Systems Review of Systems  Constitutional:  Positive for chills. Negative for appetite change and fever.  HENT:  Positive for sore throat. Negative for congestion, ear pain, rhinorrhea, sinus pressure and sinus pain.   Eyes:  Negative for redness and visual disturbance.  Respiratory:  Negative for cough, chest tightness, shortness of breath and wheezing.   Cardiovascular:  Negative for chest pain and palpitations.  Gastrointestinal:  Negative for abdominal pain, constipation, diarrhea, nausea and vomiting.  Genitourinary:  Negative for dysuria, frequency and urgency.  Musculoskeletal:  Negative  for myalgias.  Neurological:  Negative for dizziness, weakness and headaches.  Psychiatric/Behavioral:  Negative for confusion.   All other systems reviewed and are negative.   Physical Exam Triage Vital Signs ED Triage Vitals [06/12/21 1022]  Enc Vitals Group     BP      Pulse      Resp      Temp      Temp src      SpO2      Weight      Height      Head Circumference      Peak Flow      Pain Score 9     Pain Loc      Pain Edu?      Excl. in GC?    No data found.  Updated Vital Signs BP 137/83   Pulse 83   Temp (!) 100.5 F (38.1 C) (Oral)   Resp 18   SpO2 100%   Visual Acuity Right Eye Distance:   Left Eye Distance:   Bilateral Distance:    Right  Eye Near:   Left Eye Near:    Bilateral Near:     Physical Exam Vitals reviewed.  Constitutional:      General: She is not in acute distress.    Appearance: Normal appearance. She is not ill-appearing.  HENT:     Head: Normocephalic and atraumatic.     Right Ear: Hearing, tympanic membrane, ear canal and external ear normal. No swelling or tenderness. There is no impacted cerumen. No mastoid tenderness. Tympanic membrane is not perforated, erythematous, retracted or bulging.     Left Ear: Hearing, tympanic membrane, ear canal and external ear normal. No swelling or tenderness. There is no impacted cerumen. No mastoid tenderness. Tympanic membrane is not perforated, erythematous, retracted or bulging.     Nose:     Right Sinus: No maxillary sinus tenderness or frontal sinus tenderness.     Left Sinus: No maxillary sinus tenderness or frontal sinus tenderness.     Mouth/Throat:     Mouth: Mucous membranes are moist.     Pharynx: Uvula midline. Posterior oropharyngeal erythema present. No oropharyngeal exudate.     Tonsils: No tonsillar exudate. 2+ on the right. 2+ on the left.     Comments: Smooth erythema posterior pharynx  On exam, uvula is midline, she is tolerating her secretions without difficulty, there is no trismus, no drooling, she has normal phonation  Cardiovascular:     Rate and Rhythm: Normal rate and regular rhythm.     Heart sounds: Normal heart sounds.  Pulmonary:     Breath sounds: Normal breath sounds and air entry. No wheezing, rhonchi or rales.  Chest:     Chest wall: No tenderness.  Abdominal:     General: Abdomen is flat. Bowel sounds are normal.     Tenderness: There is no abdominal tenderness. There is no guarding or rebound.  Lymphadenopathy:     Cervical: Cervical adenopathy present.     Right cervical: Superficial cervical adenopathy present.  Neurological:     General: No focal deficit present.     Mental Status: She is alert and oriented to person,  place, and time.  Psychiatric:        Attention and Perception: Attention and perception normal.        Mood and Affect: Mood and affect normal.        Behavior: Behavior normal. Behavior is cooperative.  Thought Content: Thought content normal.        Judgment: Judgment normal.     UC Treatments / Results  Labs (all labs ordered are listed, but only abnormal results are displayed) Labs Reviewed  POCT RAPID STREP A, ED / UC    EKG   Radiology No results found.  Procedures Procedures (including critical care time)  Medications Ordered in UC Medications  acetaminophen (TYLENOL) tablet 650 mg (650 mg Oral Given 06/12/21 1053)    Initial Impression / Assessment and Plan / UC Course  I have reviewed the triage vital signs and the nursing notes.  Pertinent labs & imaging results that were available during my care of the patient were reviewed by me and considered in my medical decision making (see chart for details).      This patient is a very pleasant 35 y.o. year old female presenting with suspected strep pharyngitis.  Febrile at 100.5, nontachycardic, nontachypneic. On exam, uvula is midline, she is tolerating her secretions without difficulty, there is no trismus, no drooling, she has normal phonation  Rapid strep negative, culture sent.  Given positive exposure to strep and centor score 4, will still treat empirically for this today. She states she is not pregnant. Augmentin as below.   ED return precautions discussed. Patient verbalizes understanding and agreement.   Coding this visit a Level 4 for acute illness with systemic symptoms (strep pharyngitis with fevers) and prescription drug management.  Final Clinical Impressions(s) / UC Diagnoses   Final diagnoses:  Strep pharyngitis  Exposure to strep throat     Discharge Instructions      -Start the antibiotic-Augmentin (amoxicillin-clavulanate), 1 pill every 12 hours for 7 days.  You can take this  with food like with breakfast and dinner. -Take Tylenol 1000 mg 3 times daily, and ibuprofen 800 mg 3 times daily with food.  You can take these together, or alternate every 3-4 hours. -Seek additional medical attention if symptoms get worse spite treatment, like trouble swallowing, shortness of breath, dizziness. -strep is still contagious for 24 hours after starting the antibiotic     ED Prescriptions     Medication Sig Dispense Auth. Provider   amoxicillin-clavulanate (AUGMENTIN) 875-125 MG tablet Take 1 tablet by mouth every 12 (twelve) hours. 14 tablet Rhys Martini, PA-C      PDMP not reviewed this encounter.   Rhys Martini, PA-C 06/12/21 1143

## 2021-06-13 ENCOUNTER — Ambulatory Visit: Payer: BC Managed Care – PPO

## 2021-06-17 ENCOUNTER — Encounter: Payer: Self-pay | Admitting: Family Medicine

## 2021-09-30 ENCOUNTER — Telehealth: Payer: Self-pay

## 2021-09-30 DIAGNOSIS — B9689 Other specified bacterial agents as the cause of diseases classified elsewhere: Secondary | ICD-10-CM

## 2021-09-30 MED ORDER — METRONIDAZOLE 500 MG PO TABS: 500.0000 mg | ORAL_TABLET | Freq: Two times a day (BID) | ORAL | 0 refills | Status: DC

## 2021-09-30 NOTE — Telephone Encounter (Signed)
Pt called requesting a Rx for BV. Pt states she is having discharge, itching, and odor. Flagyl 500 mg PO bid x 7 days was sent to her pharmacy. Zamauri Nez l Zakiyah Diop, CMA

## 2021-10-23 ENCOUNTER — Encounter: Payer: Self-pay | Admitting: Family Medicine

## 2021-11-11 ENCOUNTER — Ambulatory Visit (INDEPENDENT_AMBULATORY_CARE_PROVIDER_SITE_OTHER): Payer: BC Managed Care – PPO

## 2021-11-11 ENCOUNTER — Other Ambulatory Visit: Payer: Self-pay

## 2021-11-11 ENCOUNTER — Other Ambulatory Visit (HOSPITAL_COMMUNITY)
Admission: RE | Admit: 2021-11-11 | Discharge: 2021-11-11 | Disposition: A | Discharge status: Home / Self Care | Payer: BC Managed Care – PPO | Source: Ambulatory Visit | Attending: Obstetrics & Gynecology | Admitting: Obstetrics & Gynecology

## 2021-11-11 VITALS — BP 112/72 | HR 78 | Wt 239.0 lb

## 2021-11-11 DIAGNOSIS — N898 Other specified noninflammatory disorders of vagina: Secondary | ICD-10-CM

## 2021-11-11 NOTE — Progress Notes (Addendum)
SUBJECTIVE:  35 y.o. female complains of yellow/green vaginal discharge for 1  week(s). Denies abnormal vaginal bleeding or significant pelvic pain or fever. No UTI symptoms. Denies history of known exposure to STD.  No LMP recorded. (Menstrual status: IUD).  OBJECTIVE:  She appears well, afebrile. Urine dipstick: not done.  ASSESSMENT:  Vaginal Discharge  Vaginal Odor   PLAN:  GC, chlamydia, trichomonas, BVAG, CVAG probe sent to lab. Treatment: To be determined once lab results are received ROV prn if symptoms persist or worsen.  Amariyon Maynes l Latesa Fratto, CMA   Attestation of Attending Supervision of CMA/RN: Evaluation and management procedures were performed by the nurse under my supervision and collaboration.  I have reviewed the nursing note and chart, and I agree with the management and plan.  Carolyn L. Harraway-Smith, M.D., [facility]

## 2021-11-12 LAB — CERVICOVAGINAL ANCILLARY ONLY
Bacterial Vaginitis (gardnerella): POSITIVE — AB
Candida Glabrata: NEGATIVE
Candida Vaginitis: NEGATIVE
Chlamydia: NEGATIVE
Comment: NEGATIVE
Comment: NEGATIVE
Comment: NEGATIVE
Comment: NEGATIVE
Comment: NEGATIVE
Comment: NORMAL
Neisseria Gonorrhea: NEGATIVE
Trichomonas: NEGATIVE

## 2021-11-13 ENCOUNTER — Other Ambulatory Visit: Payer: Self-pay | Admitting: Obstetrics & Gynecology

## 2021-11-13 ENCOUNTER — Other Ambulatory Visit: Payer: Self-pay

## 2021-11-13 DIAGNOSIS — B9689 Other specified bacterial agents as the cause of diseases classified elsewhere: Secondary | ICD-10-CM

## 2021-11-13 MED ORDER — METRONIDAZOLE 500 MG PO TABS: 500.0000 mg | ORAL_TABLET | Freq: Two times a day (BID) | ORAL | 0 refills | Status: DC

## 2021-11-13 NOTE — Progress Notes (Signed)
Patient requested change of pharmacy for her BV medication. Armandina Stammer RN

## 2021-12-26 ENCOUNTER — Encounter: Payer: Self-pay | Admitting: Family Medicine

## 2022-02-26 ENCOUNTER — Ambulatory Visit: Payer: BC Managed Care – PPO | Admitting: Family Medicine

## 2022-02-26 ENCOUNTER — Encounter: Payer: Self-pay | Admitting: Obstetrics & Gynecology

## 2022-02-26 ENCOUNTER — Other Ambulatory Visit: Payer: Self-pay

## 2022-02-26 ENCOUNTER — Other Ambulatory Visit (HOSPITAL_COMMUNITY)
Admission: RE | Admit: 2022-02-26 | Discharge: 2022-02-26 | Disposition: A | Discharge status: Home / Self Care | Payer: Managed Care, Other (non HMO) | Source: Ambulatory Visit | Attending: Obstetrics & Gynecology | Admitting: Obstetrics & Gynecology

## 2022-02-26 ENCOUNTER — Ambulatory Visit (INDEPENDENT_AMBULATORY_CARE_PROVIDER_SITE_OTHER): Payer: Managed Care, Other (non HMO) | Admitting: Obstetrics & Gynecology

## 2022-02-26 VITALS — BP 115/84 | HR 89 | Wt 238.0 lb

## 2022-02-26 DIAGNOSIS — N898 Other specified noninflammatory disorders of vagina: Secondary | ICD-10-CM

## 2022-02-26 DIAGNOSIS — Z01419 Encounter for gynecological examination (general) (routine) without abnormal findings: Secondary | ICD-10-CM | POA: Insufficient documentation

## 2022-02-26 NOTE — Progress Notes (Signed)
Subjective:  ?  ? Erica [name] is a 36 y.o. female here for a routine exam.  Current complaints: pt reports freq BV. She tried a probiotic but, does not think it helped. She would like to have her mons eval as she feels a lesion that she wants eval.     ?  ?Gynecologic History ?Patient's last menstrual period was 02/05/2022. ?Contraception: IUD ?Last Pap: 08/01/2019. Results were: normal ?Last mammogram: n/a.  ? ?Obstetric History ?OB History  ?Gravida Para Term Preterm AB Living  ?3 3 3  0 0 3  ?SAB IAB Ectopic Multiple Live Births  ?0 0 0 0 3  ?  ?# Outcome Date GA Lbr Len/2nd Weight Sex Delivery Anes PTL Lv  ?3 Term 09/17/11 [redacted]w[redacted]d 09:56 / 00:38 10 lb 1.4 oz (4.576 kg) M Vag-Spont EPI  LIV  ?   Birth Comments: wnl  ?2 Term 03/31/09 [redacted]w[redacted]d  7 lb 13 oz (3.544 kg) F Vag-Spont  N LIV  ?1 Term 09/02/06 [redacted]w[redacted]d  7 lb 12 oz (3.515 kg) F Vag-Spont  N LIV  ? ? ? ?The following portions of the patient's history were reviewed and updated as appropriate: allergies, current medications, past family history, past medical history, past social history, past surgical history, and problem list. ? ?Review of Systems ?Pertinent items are noted in HPI.  ?  ?Objective:  ?BP 115/84   Pulse 89   Wt 238 lb (108 kg)   LMP 02/05/2022   BMI 42.16 kg/m?  ?General Appearance:    Alert, cooperative, no distress, appears stated age  ?Head:    Normocephalic, without obvious abnormality, atraumatic  ?Eyes:    conjunctiva/corneas clear, EOM's intact, both eyes  ?Ears:    Normal external ear canals, both ears  ?Nose:   Nares normal, septum midline, mucosa normal, no drainage    or sinus tenderness  ?Throat:   Lips, mucosa, and tongue normal; teeth and gums normal  ?Neck:   Supple, symmetrical, trachea midline, no adenopathy;  ?  thyroid:  no enlargement/tenderness/nodules  ?Back:     Symmetric, no curvature, ROM normal, no CVA tenderness  ?Lungs:     respirations unlabored  ?Chest Wall:    No tenderness or deformity  ? Heart:    Regular rate  and rhythm  ?Breast Exam:    No tenderness, masses, or nipple abnormality  ?Abdomen:     Soft, non-tender, bowel sounds active all four quadrants,  ?  no masses, no organomegaly  ?Genitalia:    Normal female without lesion, discharge or tenderness  ? IUD stings noted.   ?Extremities:   Extremities normal, atraumatic, no cyanosis or edema  ?Pulses:   2+ and symmetric all extremities  ?Skin:   Skin color, texture, turgor normal, no rashes or lesions  ? ? ? ? ?Assessment:  ? ? Healthy female exam.  ?Recurrent BV- d/w pt diet, reviewed the GO WHITE protocol and boric acid ?  ?Plan:  ? ?Erica [name] was seen today for annual exam. ? ?Diagnoses and all orders for this visit: ? ?Well woman exam with routine gynecological exam ?-     Cytology - PAP( [facility]) ? ?Vaginal discharge ?-     Cervicovaginal ancillary only( [facility]) ? ? F/u in 1 year or sooner prn   ? ?Erica [name], M.D., FACOG ? ?

## 2022-02-26 NOTE — Progress Notes (Deleted)
    SUBJECTIVE:   CHIEF COMPLAINT / HPI:   ***  PERTINENT  PMH / PSH: ***  OBJECTIVE:   There were no vitals taken for this visit.  ***  ASSESSMENT/PLAN:   No problem-specific Assessment & Plan notes found for this encounter.     Janeane Cozart Autry-Lott, DO [facility]   

## 2022-02-26 NOTE — Patient Instructions (Incomplete)
It was wonderful to see you today. ? ?Please bring ALL of your medications with you to every visit.  ? ?Today we talked about: ? ?** ? ?Please be sure to schedule follow up at the front  desk before you leave today.  ? ?If you haven't already, sign up for My Chart to have easy access to your labs results, and communication with your primary care physician. ? ?Please call the clinic at [phone] if your symptoms worsen or you have any concerns. It was our pleasure to serve you. ? ?Dr. Autry-Lott ? ?

## 2022-02-28 LAB — CERVICOVAGINAL ANCILLARY ONLY
Bacterial Vaginitis (gardnerella): POSITIVE — AB
Candida Glabrata: NEGATIVE
Candida Vaginitis: NEGATIVE
Comment: NEGATIVE
Comment: NEGATIVE
Comment: NEGATIVE

## 2022-03-03 ENCOUNTER — Ambulatory Visit: Payer: BC Managed Care – PPO | Admitting: Family Medicine

## 2022-03-03 LAB — CYTOLOGY - PAP
Comment: NEGATIVE
Diagnosis: NEGATIVE
High risk HPV: NEGATIVE

## 2022-03-03 NOTE — Progress Notes (Incomplete)
? ? ?  SUBJECTIVE:  ? ?CHIEF COMPLAINT / HPI:  ? ?*** ? ?PERTINENT  PMH / PSH: *** ? ?OBJECTIVE:  ? ?LMP 02/05/2022   ?*** ? ?ASSESSMENT/PLAN:  ? ?No problem-specific Assessment & Plan notes found for this encounter. ?  ? ? ?Shandreka Dante Autry-Lott, DO ?[facility]  ?

## 2022-03-03 NOTE — Patient Instructions (Incomplete)
It was wonderful to see you today. ? ?Please bring ALL of your medications with you to every visit.  ? ?Today we talked about: ? ?** ? ?Please be sure to schedule follow up at the front  desk before you leave today.  ? ?If you haven't already, sign up for My Chart to have easy access to your labs results, and communication with your primary care physician. ? ?Please call the clinic at [phone] if your symptoms worsen or you have any concerns. It was our pleasure to serve you. ? ?Dr. Autry-Lott ? ?

## 2022-03-06 ENCOUNTER — Other Ambulatory Visit: Payer: Self-pay | Admitting: Obstetrics & Gynecology

## 2022-03-06 MED ORDER — METRONIDAZOLE 500 MG PO TABS: 500.0000 mg | ORAL_TABLET | Freq: Two times a day (BID) | ORAL | 0 refills | Status: DC

## 2022-03-18 ENCOUNTER — Other Ambulatory Visit: Payer: Self-pay | Admitting: Obstetrics & Gynecology

## 2022-04-11 ENCOUNTER — Emergency Department (HOSPITAL_BASED_OUTPATIENT_CLINIC_OR_DEPARTMENT_OTHER): Payer: Managed Care, Other (non HMO)

## 2022-04-11 ENCOUNTER — Encounter (HOSPITAL_BASED_OUTPATIENT_CLINIC_OR_DEPARTMENT_OTHER): Payer: Self-pay | Admitting: *Deleted

## 2022-04-11 ENCOUNTER — Emergency Department (HOSPITAL_BASED_OUTPATIENT_CLINIC_OR_DEPARTMENT_OTHER)
Admission: EM | Admit: 2022-04-11 | Discharge: 2022-04-11 | Disposition: A | Discharge status: Home / Self Care | Payer: Managed Care, Other (non HMO) | Attending: Emergency Medicine | Admitting: Emergency Medicine

## 2022-04-11 ENCOUNTER — Other Ambulatory Visit: Payer: Self-pay

## 2022-04-11 DIAGNOSIS — S4992XA Unspecified injury of left shoulder and upper arm, initial encounter: Secondary | ICD-10-CM | POA: Diagnosis present

## 2022-04-11 DIAGNOSIS — S299XXA Unspecified injury of thorax, initial encounter: Secondary | ICD-10-CM | POA: Insufficient documentation

## 2022-04-11 DIAGNOSIS — Y92410 Unspecified street and highway as the place of occurrence of the external cause: Secondary | ICD-10-CM | POA: Insufficient documentation
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
## 2022-04-11 NOTE — Discharge Instructions (Addendum)
Take Tylenol and Motrin for the pain.  Your x-rays were negative, the back to work on Tuesday for feeling better.  Follow-up with primary if your symptoms persist, information provided above currently do not have 1. ?
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
## 2022-05-27 ENCOUNTER — Encounter: Payer: Self-pay | Admitting: *Deleted

## 2022-06-23 ENCOUNTER — Encounter: Payer: Self-pay | Admitting: Family Medicine

## 2022-07-21 ENCOUNTER — Other Ambulatory Visit: Payer: Self-pay | Admitting: Family Medicine

## 2022-07-21 DIAGNOSIS — B9689 Other specified bacterial agents as the cause of diseases classified elsewhere: Secondary | ICD-10-CM

## 2022-07-22 MED ORDER — METRONIDAZOLE 500 MG PO TABS: 500.0000 mg | ORAL_TABLET | Freq: Two times a day (BID) | ORAL | 0 refills | Status: DC
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
## 2022-10-30 ENCOUNTER — Other Ambulatory Visit: Payer: Self-pay

## 2022-10-30 ENCOUNTER — Encounter: Payer: Self-pay | Admitting: Obstetrics & Gynecology

## 2022-10-30 MED ORDER — FLUCONAZOLE 150 MG PO TABS: 150.0000 mg | ORAL_TABLET | Freq: Once | ORAL | 0 refills | Status: AC

## 2023-05-26 ENCOUNTER — Other Ambulatory Visit: Payer: Self-pay

## 2023-05-26 ENCOUNTER — Encounter: Payer: Self-pay | Admitting: Obstetrics & Gynecology

## 2023-05-26 DIAGNOSIS — B379 Candidiasis, unspecified: Secondary | ICD-10-CM

## 2023-05-26 MED ORDER — FLUCONAZOLE 150 MG PO TABS: ORAL_TABLET | ORAL | 1 refills | Status: DC

## 2023-05-26 NOTE — Progress Notes (Signed)
Patient sent Mychart message requesting a Rx for a yeast infection. Patient states she is having some yellow discharge and itching. Diflucan was sent to her pharmacy. Therese Rocco l Ambriel Gorelick, CMA

## 2023-06-30 ENCOUNTER — Other Ambulatory Visit (HOSPITAL_COMMUNITY)
Admission: RE | Admit: 2023-06-30 | Discharge: 2023-06-30 | Disposition: A | Discharge status: Home / Self Care | Payer: Managed Care, Other (non HMO) | Source: Ambulatory Visit | Attending: Family Medicine | Admitting: Family Medicine

## 2023-06-30 ENCOUNTER — Ambulatory Visit: Payer: Managed Care, Other (non HMO)

## 2023-06-30 VITALS — BP 131/80 | HR 77 | Wt 244.0 lb

## 2023-06-30 DIAGNOSIS — N898 Other specified noninflammatory disorders of vagina: Secondary | ICD-10-CM | POA: Insufficient documentation

## 2023-06-30 DIAGNOSIS — N760 Acute vaginitis: Secondary | ICD-10-CM

## 2023-06-30 MED ORDER — METRONIDAZOLE 500 MG PO TABS: 500.0000 mg | ORAL_TABLET | Freq: Two times a day (BID) | ORAL | 0 refills | Status: DC

## 2023-06-30 NOTE — Progress Notes (Signed)
SUBJECTIVE:  37 y.o. female complains of yellow/brown vaginal discharge for 3 day(s). Denies abnormal vaginal bleeding or significant pelvic pain or fever. No UTI symptoms. Denies history of known exposure to STD.  No LMP recorded (lmp unknown). (Menstrual status: IUD).  OBJECTIVE:  She appears well, afebrile. Urine dipstick: not done.  ASSESSMENT:  Vaginal Discharge  Vaginal Odor   PLAN:  GC, chlamydia, trichomonas, BVAG, CVAG probe sent to lab. Patient states she thinks she has BV. Flagyl was sent to her pharmacy. ROV prn if symptoms persist or worsen.   Erica [name] l Erica [name], CMA

## 2023-07-02 LAB — CERVICOVAGINAL ANCILLARY ONLY
Bacterial Vaginitis (gardnerella): POSITIVE — AB
Candida Glabrata: NEGATIVE
Candida Vaginitis: NEGATIVE
Chlamydia: NEGATIVE
Comment: NEGATIVE
Comment: NEGATIVE
Comment: NEGATIVE
Comment: NEGATIVE
Comment: NEGATIVE
Comment: NORMAL
Neisseria Gonorrhea: NEGATIVE
Trichomonas: NEGATIVE

## 2023-08-04 ENCOUNTER — Ambulatory Visit: Payer: Managed Care, Other (non HMO) | Admitting: Family Medicine

## 2023-08-04 ENCOUNTER — Encounter: Payer: Self-pay | Admitting: Family Medicine

## 2023-08-04 VITALS — BP 150/80 | HR 68 | Temp 97.7°F | Ht 63.0 in | Wt 241.4 lb

## 2023-08-04 DIAGNOSIS — R030 Elevated blood-pressure reading, without diagnosis of hypertension: Secondary | ICD-10-CM | POA: Insufficient documentation

## 2023-08-04 DIAGNOSIS — Z975 Presence of (intrauterine) contraceptive device: Secondary | ICD-10-CM | POA: Insufficient documentation

## 2023-08-04 DIAGNOSIS — J302 Other seasonal allergic rhinitis: Secondary | ICD-10-CM | POA: Insufficient documentation

## 2023-08-04 DIAGNOSIS — B9689 Other specified bacterial agents as the cause of diseases classified elsewhere: Secondary | ICD-10-CM

## 2023-08-04 DIAGNOSIS — Z23 Encounter for immunization: Secondary | ICD-10-CM | POA: Diagnosis not present

## 2023-08-04 DIAGNOSIS — N760 Acute vaginitis: Secondary | ICD-10-CM

## 2023-08-04 DIAGNOSIS — J4540 Moderate persistent asthma, uncomplicated: Secondary | ICD-10-CM

## 2023-08-04 MED ORDER — FLUTICASONE-SALMETEROL 250-50 MCG/ACT IN AEPB
1.0000 | INHALATION_SPRAY | Freq: Two times a day (BID) | RESPIRATORY_TRACT | 11 refills | Status: DC
Start: 2023-08-04 — End: 2024-03-30

## 2023-08-04 NOTE — Progress Notes (Signed)
Subjective  CC:  Chief Complaint  Patient presents with   Establish Care    HPI: Erica [name] is a 36 y.o. female is a former [facility] patient and is here to reestablish care with me today.  I reviewed records from GYN in past primary care doctors.  Reviewed lab work.  Reviewed cytology reports. She has the following concerns or needs: Lovely 37 year old G3 P3, married female, Mirena IUD in place for birth control.  Works at [employer].  Happy.  Having GYN physical tomorrow, overdue for physical with primary care. Chronic medical conditions include her chronic moderate persistent asthma.  She has been out of Advair for about a year.  Now using albuterol 3-4 times a day.  Trigger is the weather/humidity.  No recent bronchitis or pneumonias.  He has not been hospitalized.  He has chronic seasonal allergies for which he uses Allegra or Claritin as needed.  Has had history of allergic conjunctivitis.  Not currently treating. Recurrent bacterial vaginosis: Currently on Flagyl.  Managed by GYN.  Reviewed notes Morbid obesity: Has gained weight.  Wanting to lose weight, working with her daughter to eat healthy and start exercise again. Elevated blood pressure in office today.  Has had intermittently elevated readings by chart review but never been diagnosed with hypertension.  Positive family history of hypertension.  45 year old daughter has been diagnosed with primary hypertension.  Diet is regular, high sodium.  No chest pain or shortness of breath. Health maintenance: Cervical cancer screenings are up-to-date and normal.  Due for fasting blood work.  Eligible for Tdap today.  Other immunizations up-to-date  Assessment  1. Moderate persistent asthma without complication   2. Morbid obesity (HCC)   3. IUD (intrauterine device) in place   4. Bacterial vaginosis recurrent   5. Need for Tdap vaccination   6. Chronic seasonal allergic rhinitis   7. Elevated blood pressure reading without  diagnosis of hypertension      Plan  Asthma: Uncontrolled, restart Advair 250/50 twice daily.  Education given.  Monitor symptoms, should decrease albuterol need.  If not, add back Singulair.  She has used this well before.  Could also help her allergies. IUD Mirena in place. Recurrent BV: Likely related in part to IUD.  Patient agrees.  GYN manages with antibiotics, boric acid as needed Chronic seasonal allergies: Patient feels they are manageable at this point without medications.  Would add oral antihistamine or Singulair if worsen Discussed elevated blood pressure reading: Recommend low-sodium diet, weight loss and we will recheck in 3 months.  If remains elevated, set blood pressure medications. Tdap updated today routine education given  Follow up: 3 months for complete physical, follow-up blood pressure  Orders Placed This Encounter  Procedures   Tdap vaccine greater than or equal to 7yo IM   Meds ordered this encounter  Medications   fluticasone-salmeterol (ADVAIR) 250-50 MCG/ACT AEPB    Sig: Inhale 1 puff into the lungs in the morning and at bedtime.    Dispense:  60 each    Refill:  11      We updated and reviewed the patient's past history in detail and it is documented below.  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/02/2021    Bacterial vaginosis recurrent 11/10/2016    Asthma, moderate persistent 04/10/2015  Health Maintenance  Topic Date Due   COVID-19 Vaccine (3 - 2023-24 season) 08/20/2023 (Originally 08/22/2022)   INFLUENZA VACCINE  10/06/2023 (Originally 07/23/2023)   PAP SMEAR-Modifier  02/27/2027   DTaP/Tdap/Td (4 - Td or Tdap) 08/03/2033   Hepatitis C Screening  Completed   HIV Screening  Completed   HPV VACCINES  Aged Out   Immunization History  Administered Date(s)  Administered   Influenza Split 09/08/2011   Influenza Whole 10/28/2010   Influenza,inj,Quad PF,6+ Mos 07/30/2016   Influenza-Unspecified 08/22/2014, 08/23/2015, 08/22/2017, 09/11/2018, 07/29/2019, 09/05/2020   Janssen (J&J) SARS-COV-2 Vaccination 07/31/2020, 12/21/2020   Rho (D) Immune Globulin 06/05/2011, 08/01/2011, 09/18/2011   Td 10/26/2003   Tdap 09/18/2011, 08/04/2023   Current Meds  Medication Sig   albuterol (VENTOLIN HFA) 108 (90 Base) MCG/ACT inhaler INHALE ONE TO TWO PUFFS BY MOUTH EVERY 4 HOURS AS NEEDED FOR SHORTNESS OF BREATH   fluticasone-salmeterol (ADVAIR) 250-50 MCG/ACT AEPB Inhale 1 puff into the lungs in the morning and at bedtime.   levonorgestrel (MIRENA) 20 MCG/24HR IUD 1 each by Intrauterine route once.   metroNIDAZOLE (FLAGYL) 500 MG tablet Take 1 tablet (500 mg total) by mouth 2 (two) times daily.    Allergies: Patient is allergic to cashew nut oil and pistachio nut (diagnostic). Past Medical History Patient  has a past medical history of Anemia, Asthma, Environmental allergies, Pregnancy induced hypertension, Rh incompatibility, and Varicosities. Past Surgical History Patient  has no past surgical history on file. Family History: Patient family history includes Asthma in her mother; Drug abuse in her father; Hepatitis in her daughter; Hypertension in her father and mother; Rashes / Skin problems in her mother. Social History:  Patient  reports that she has never smoked. She has never used smokeless tobacco. She reports that she does not drink alcohol and does not use drugs.  Review of Systems: Constitutional: negative for fever or malaise Ophthalmic: negative for photophobia, double vision or loss of vision Cardiovascular: negative for chest pain, dyspnea on exertion, or new LE swelling Respiratory: negative for SOB or persistent cough Gastrointestinal: negative for abdominal pain, change in bowel habits or melena Genitourinary: negative for dysuria or  gross hematuria Musculoskeletal: negative for new gait disturbance or muscular weakness Integumentary: negative for new or persistent rashes Neurological: negative for TIA or stroke symptoms Psychiatric: negative for SI or delusions Allergic/Immunologic: negative for hives  Patient Care Team    Relationship Specialty Notifications Start End  Willow Ora, MD PCP - General Family Medicine  08/04/23     Objective  Vitals: BP (!) 150/80   Pulse 68   Temp 97.7 F (36.5 C)   Ht 5\' 3"  (1.6 m)   Wt 241 lb 6.4 oz (109.5 kg)   SpO2 99%   BMI 42.76 kg/m  General:  Well developed, well nourished, no acute distress  Psych:  Alert and oriented,normal mood and affect HEENT:  Normocephalic, atraumatic, non-icteric sclera, PERRL, oropharynx is without mass or exudate, supple neck without adenopathy, mass or thyromegaly Cardiovascular:  RRR without gallop, rub or murmur, nondisplaced PMI Respiratory:  Good breath sounds bilaterally, CTAB with normal respiratory effort Extremities: No edema  Commons side effects, risks, benefits, and alternatives for medications and treatment plan prescribed today were discussed, and the patient expressed understanding of the given instructions. Patient is instructed to call or message via MyChart if he/she has any questions or concerns regarding our treatment plan. No barriers to understanding were identified. We discussed Red Flag symptoms and signs in detail.  Patient expressed understanding regarding what to do in case of urgent or emergency type symptoms.  Medication list was reconciled, printed and provided to the patient in AVS. Patient instructions and summary information was reviewed with the patient as documented in the AVS. This note was prepared with assistance of Dragon voice recognition software. Occasional wrong-word or sound-a-like substitutions may have occurred due to the inherent limitations of voice recognition software

## 2023-08-04 NOTE — Patient Instructions (Signed)
Please return in 3 months for complete physical and follow-up blood pressure.  Come fasting that appointment for lab work  If you have any questions or concerns, please don't hesitate to send me a message via MyChart or call the office at [phone]. Thank you for visiting with [place] today! It's our pleasure caring for you.   "Food Rules": a book on healthy eating by Cristal Ford The quick bottom line quote is: "Eat (real) food... Not too much ... Mostly plants"  Fat and Cholesterol Restricted Eating Plan Getting too much fat and cholesterol in your diet may cause health problems. Choosing the right foods helps keep your fat and cholesterol at normal levels. This can keep you from getting certain diseases. Your doctor may recommend an eating plan that includes: Total fat: ______% or less of total calories a day. This is ______g of fat a day. Saturated fat: ______% or less of total calories a day. This is ______g of saturated fat a day. Cholesterol: less than _________mg a day. Fiber: ______g a day. What are tips for following this plan? General tips Work with your doctor to lose weight if you need to. Avoid: Foods with added sugar. Fried foods. Foods with trans fat or partially hydrogenated oils. This includes some margarines and baked goods. If you drink alcohol: Limit how much you have to: 0-1 drink a day for women who are not pregnant. 0-2 drinks a day for men. Know how much alcohol is in a drink. In the [place] one drink equals one 12 oz bottle of beer (355 mL), one 5 oz glass of wine (148 mL), or one 1 oz glass of hard liquor (44 mL). Reading food labels Check food labels for: Trans fats. Partially hydrogenated oils. Saturated fat (g) in each serving. Cholesterol (mg) in each serving. Fiber (g) in each serving. Choose foods with healthy fats, such as: Monounsaturated fats and polyunsaturated fats. These include olive and canola oil, flaxseeds, walnuts, almonds, and  seeds. Omega-3 fats. These are found in certain fish, flaxseed oil, and ground flaxseeds. Choose grain products that have whole grains. Look for the word "whole" as the first word in the ingredient list. Cooking Cook foods using low-fat methods. These include baking, boiling, grilling, and broiling. Eat more home-cooked foods. Eat at restaurants and buffets less often. Eat less fast food. Avoid cooking using saturated fats, such as butter, cream, palm oil, palm kernel oil, and coconut oil. Meal planning  At meals, divide your plate into four equal parts: Fill one-half of your plate with vegetables, green salads, and fruit. Fill one-fourth of your plate with whole grains. Fill one-fourth of your plate with low-fat (lean) protein foods. Eat fish that is high in omega-3 fats at least two times a week. This includes mackerel, tuna, sardines, and salmon. Eat foods that are high in fiber, such as whole grains, beans, apples, pears, berries, broccoli, carrots, peas, and barley. What foods should I eat? Fruits All fresh, canned (in natural juice), or frozen fruits. Vegetables Fresh or frozen vegetables (raw, steamed, roasted, or grilled). Green salads. Grains Whole grains, such as whole wheat or whole grain breads, crackers, cereals, and pasta. Unsweetened oatmeal, bulgur, barley, quinoa, or brown rice. Corn or whole wheat flour tortillas. Meats and other protein foods Ground beef (85% or leaner), grass-fed beef, or beef trimmed of fat. Skinless chicken or [place]. Ground chicken or [place]. Pork trimmed of fat. All fish and seafood. Egg whites. Dried beans, peas, or lentils. Unsalted nuts or seeds. Unsalted  canned beans. Nut butters without added sugar or oil. Dairy Low-fat or nonfat dairy products, such as skim or 1% milk, 2% or reduced-fat cheeses, low-fat and fat-free ricotta or cottage cheese, or plain low-fat and nonfat yogurt. Fats and oils Tub margarine without trans fats. Light or  reduced-fat mayonnaise and salad dressings. Avocado. Olive, canola, sesame, or safflower oils. The items listed above may not be a complete list of foods and beverages you can eat. Contact a dietitian for more information. What foods should I avoid? Fruits Canned fruit in heavy syrup. Fruit in cream or butter sauce. Fried fruit. Vegetables Vegetables cooked in cheese, cream, or butter sauce. Fried vegetables. Grains White bread. White pasta. White rice. Cornbread. Bagels, pastries, and croissants. Crackers and snack foods that contain trans fat and hydrogenated oils. Meats and other protein foods Fatty cuts of meat. Ribs, chicken wings, bacon, sausage, bologna, salami, chitterlings, fatback, hot dogs, bratwurst, and packaged lunch meats. Liver and organ meats. Whole eggs and egg yolks. Chicken and [place] with skin. Fried meat. Dairy Whole or 2% milk, cream, half-and-half, and cream cheese. Whole milk cheeses. Whole-fat or sweetened yogurt. Full-fat cheeses. Nondairy creamers and whipped toppings. Processed cheese, cheese spreads, and cheese curds. Fats and oils Butter, stick margarine, lard, shortening, ghee, or bacon fat. Coconut, palm kernel, and palm oils. Beverages Alcohol. Sugar-sweetened drinks such as sodas, lemonade, and fruit drinks. Sweets and desserts Corn syrup, sugars, honey, and molasses. Candy. Jam and jelly. Syrup. Sweetened cereals. Cookies, pies, cakes, donuts, muffins, and ice cream. The items listed above may not be a complete list of foods and beverages you should avoid. Contact a dietitian for more information. Summary Choosing the right foods helps keep your fat and cholesterol at normal levels. This can keep you from getting certain diseases. At meals, fill one-half of your plate with vegetables, green salads, and fruits. Eat high fiber foods, like whole grains, beans, apples, pears, berries, carrots, peas, and barley. Limit added sugar, saturated fats, alcohol, and  fried foods. This information is not intended to replace advice given to you by your health care provider. Make sure you discuss any questions you have with your health care provider. Document Revised: 04/19/2021 Document Reviewed: 04/19/2021 Elsevier Patient Education  2024 [employer].

## 2023-08-05 ENCOUNTER — Encounter: Payer: Self-pay | Admitting: Obstetrics & Gynecology

## 2023-08-05 ENCOUNTER — Other Ambulatory Visit (HOSPITAL_COMMUNITY)
Admission: RE | Admit: 2023-08-05 | Discharge: 2023-08-05 | Disposition: A | Discharge status: Home / Self Care | Payer: Managed Care, Other (non HMO) | Source: Ambulatory Visit | Attending: Obstetrics & Gynecology | Admitting: Obstetrics & Gynecology

## 2023-08-05 ENCOUNTER — Ambulatory Visit: Payer: Managed Care, Other (non HMO) | Admitting: Obstetrics & Gynecology

## 2023-08-05 VITALS — BP 140/85 | HR 79 | Ht 63.0 in | Wt 238.0 lb

## 2023-08-05 DIAGNOSIS — N898 Other specified noninflammatory disorders of vagina: Secondary | ICD-10-CM | POA: Insufficient documentation

## 2023-08-05 DIAGNOSIS — B9689 Other specified bacterial agents as the cause of diseases classified elsewhere: Secondary | ICD-10-CM

## 2023-08-05 DIAGNOSIS — N760 Acute vaginitis: Secondary | ICD-10-CM

## 2023-08-05 DIAGNOSIS — Z01419 Encounter for gynecological examination (general) (routine) without abnormal findings: Secondary | ICD-10-CM

## 2023-08-05 MED ORDER — METRONIDAZOLE 0.75 % VA GEL: 1.0000 | Freq: Every day | VAGINAL | 5 refills | Status: AC

## 2023-08-05 NOTE — Progress Notes (Signed)
Subjective:     Erica [name] is a 37 y.o. female here for a routine exam.  Current complaints: recurrent BV.     Gynecologic History Patient's last menstrual period was 06/27/2023 (approximate). Contraception: IUD Last Pap: 02/26/2022. Results were: normal Last mammogram: n/a.   Obstetric History OB History  Gravida Para Term Preterm AB Living  3 3 3  0 0 3  SAB IAB Ectopic Multiple Live Births  0 0 0 0 3    # Outcome Date GA Lbr Len/2nd Weight Sex Type Anes PTL Lv  3 Term 09/17/11 [redacted]w[redacted]d 09:56 / 00:38 10 lb 1.4 oz (4.576 kg) M Vag-Spont EPI  LIV     Birth Comments: wnl  2 Term 03/31/09 [redacted]w[redacted]d  7 lb 13 oz (3.544 kg) F Vag-Spont  N LIV  1 Term 09/02/06 [redacted]w[redacted]d  7 lb 12 oz (3.515 kg) F Vag-Spont  N LIV   The following portions of the patient's history were reviewed and updated as appropriate: allergies, current medications, past family history, past medical history, past social history, past surgical history, and problem list.  Review of Systems Pertinent items are noted in HPI.    Objective:  BP (!) 140/85   Pulse 79   Ht 5\' 3"  (1.6 m)   Wt 238 lb (108 kg)   LMP 06/27/2023 (Approximate) Comment: just spotting  BMI 42.16 kg/m  General Appearance:    Alert, cooperative, no distress, appears stated age  Head:    Normocephalic, without obvious abnormality, atraumatic  Eyes:    conjunctiva/corneas clear, EOM's intact, both eyes  Ears:    Normal external ear canals, both ears  Nose:   Nares normal, septum midline, mucosa normal, no drainage    or sinus tenderness  Throat:   Lips, mucosa, and tongue normal; teeth and gums normal  Neck:   Supple, symmetrical, trachea midline, no adenopathy;    thyroid:  no enlargement/tenderness/nodules  Back:     Symmetric, no curvature, ROM normal, no CVA tenderness  Lungs:     respirations unlabored  Chest Wall:    No tenderness or deformity   Heart:    Regular rate and rhythm  Breast Exam:    No tenderness, masses, or nipple abnormality   Abdomen:     Soft, non-tender, bowel sounds active all four quadrants,    no masses, no organomegaly  Genitalia:    Normal female without lesion, discharge or tenderness     Extremities:   Extremities normal, atraumatic, no cyanosis or edema  Pulses:   2+ and symmetric all extremities  Skin:   Skin color, texture, turgor normal, no rashes or lesions     Assessment:    Healthy female exam.    Plan:  Diagnoses and all orders for this visit:  Well female exam with routine gynecological exam  Vaginal odor -     Cervicovaginal ancillary only( [place])  BV (bacterial vaginosis) -     metroNIDAZOLE (METROGEL) 0.75 % vaginal gel; Place 1 Applicatorful vaginally at bedtime. Apply one applicatorful to vagina at bedtime for 10 days, then twice a week for 6 months.   F/u in 1 year or sooner prn   Jannett Schmall L. Harraway-Smith, M.D., [facility]

## 2023-08-05 NOTE — Patient Instructions (Signed)
Surgery to Prevent Pregnancy Sterilization is surgery to prevent pregnancy. Sterilization is permanent. It should only be done if you are sure that you do not want to have children. For females, the fallopian tubes are either blocked or closed off. When the fallopian tubes are closed, the eggs that the ovaries release cannot enter the uterus, sperm cannot reach the eggs, and pregnancy is prevented. For males, the vas deferens is cut and then tied or burned (cauterized). The vas deferens is a tube that carries sperm from the testicles. This procedure prevents pregnancy by blocking sperm from going through the vas deferens and penis during ejaculation. Types of sterilization     For females, the surgeries include: Laparoscopic tubal ligation. In this surgery, the fallopian tubes are tied off, sealed with heat, or blocked with a clip, ring, or clamp. A small portion of each fallopian tube may also be removed. This surgery is done through several small cuts (incisions) with special instruments that are inserted into the abdomen. Postpartum tubal ligation. This is also called a mini-laparotomy. This surgery is done right after childbirth or 1 or 2 days after childbirth. In this surgery, the fallopian tubes are tied off, sealed with heat, or blocked with a clip, ring, or clamp. A small portion of each fallopian tube may also be removed. The surgery is done through a single incision in the abdomen. Tubal ligation during a C-section. In this surgery, the fallopian tubes are tied off, sealed with heat, or blocked with a clip, ring, or clamp. A small portion of each fallopian tube may also be removed. The surgery is done at the same time as a C-section delivery. For males, the surgeries include: Incision vasectomy. In this surgery, one or two small incisions are made in the scrotum. The vas deferens will be pulled out of the scrotum and cut. The vas deferens will be tied off or sealed with heat and placed back  into your scrotum. The incision will be closed with absorbable stitches (sutures). No scalpel vasectomy. In this surgery, a punctured opening is made in the scrotum. The vas deferens will be pulled out of the scrotum and cut. The vas deferens will be tied off or sealed with heat and placed back into your scrotum. The opening is small and will not require sutures. What are the benefits of sterilization? It is usually effective for a lifetime. The procedures are generally safe. For females, sterilization does not affect the hormones, like other types of birth control. Because of this, menstrual periods will not be affected. For both males and females, sexual desire and sexual performance will not be affected. What are the disadvantages of sterilization? Risks from the surgery Generally, sterilization is safe. Complications are rare. However, there are some risks. They include: Bleeding. Infection. Reaction to medicine used during the procedure. Injury to surrounding organs. Risks after sterilization After a successful surgery, you may have other problems. Female sterilization risks may include: Failure of the procedure. Sterilization is nearly 100% effective, but it can fail. In rare cases, the fallopian tubes can grow back together over time. If this happens, a female will be able to get pregnant again. A higher risk of having an ectopic pregnancy. An ectopic pregnancy is a pregnancy that grows outside of the uterus. This kind of pregnancy can lead to serious bleeding if it is not treated. Female sterilization risks may include: Bleeding and swelling of the scrotum. Failure of the procedure. There is a very small chance that the tied  or cauterized ends of the vas deferens may reconnect (recanalization). If this happens, a female could still make a female pregnant. Other risks may include: A risk that you may change your mind and decide you want have children. Sterilization may be reversed, but a  reversal is not always successful. Lack of protection against sexually transmitted infections (STIs). What happens during the procedure? The steps of the procedure depend on the type of sterilization you are having. The procedure may vary among health care providers and hospitals. Questions to ask your health care provider How effective are sterilization procedures? What type of procedure is right for me? Is it possible to reverse the procedure if I change my mind? What can I expect after the procedure? Where to find more information [employer]: RoboDrop.com.cy [place]. Department of Health and Human Services: ConventionalMedicines.si [facility]: www.urologyhealth.org Summary Sterilization is surgery to prevent pregnancy. There are different types of sterilization surgeries. Sterilization may be reversed, but a reversal is not always successful. Sterilization does not protect against STIs. This information is not intended to replace advice given to you by your health care provider. Make sure you discuss any questions you have with your health care provider. Document Revised: 09/08/2020 Document Reviewed: 09/08/2020 Elsevier Patient Education  2024 [employer].

## 2023-08-05 NOTE — Progress Notes (Signed)
Patient presents for annual exam. Last pap:02-26-22 WNL

## 2023-08-07 ENCOUNTER — Encounter: Payer: Self-pay | Admitting: Obstetrics & Gynecology

## 2023-08-07 LAB — CERVICOVAGINAL ANCILLARY ONLY
Bacterial Vaginitis (gardnerella): POSITIVE — AB
Candida Glabrata: NEGATIVE
Candida Vaginitis: NEGATIVE
Comment: NEGATIVE
Comment: NEGATIVE
Comment: NEGATIVE

## 2023-08-25 ENCOUNTER — Encounter: Payer: Self-pay | Admitting: Family Medicine

## 2023-08-26 ENCOUNTER — Encounter: Payer: Self-pay | Admitting: Obstetrics & Gynecology

## 2023-08-26 ENCOUNTER — Other Ambulatory Visit: Payer: Self-pay | Admitting: Obstetrics & Gynecology

## 2023-08-26 NOTE — Telephone Encounter (Signed)
Spoke with pt and gave her Andy's recommendations

## 2023-11-17 ENCOUNTER — Encounter: Payer: Managed Care, Other (non HMO) | Admitting: Family Medicine

## 2023-12-21 ENCOUNTER — Ambulatory Visit (INDEPENDENT_AMBULATORY_CARE_PROVIDER_SITE_OTHER): Payer: Managed Care, Other (non HMO) | Admitting: Family Medicine

## 2023-12-21 ENCOUNTER — Encounter: Payer: Self-pay | Admitting: Family Medicine

## 2023-12-21 VITALS — BP 132/88 | HR 74 | Temp 97.7°F | Ht 63.0 in | Wt 236.8 lb

## 2023-12-21 DIAGNOSIS — J4540 Moderate persistent asthma, uncomplicated: Secondary | ICD-10-CM

## 2023-12-21 DIAGNOSIS — Z0001 Encounter for general adult medical examination with abnormal findings: Secondary | ICD-10-CM

## 2023-12-21 DIAGNOSIS — R030 Elevated blood-pressure reading, without diagnosis of hypertension: Secondary | ICD-10-CM

## 2023-12-21 DIAGNOSIS — Z6841 Body Mass Index (BMI) 40.0 and over, adult: Secondary | ICD-10-CM

## 2023-12-21 DIAGNOSIS — L732 Hidradenitis suppurativa: Secondary | ICD-10-CM

## 2023-12-21 LAB — LIPID PANEL
Cholesterol: 147 mg/dL (ref 0–200)
HDL: 37.1 mg/dL — ABNORMAL LOW (ref >39.00)
LDL Cholesterol: 94 mg/dL (ref 0–99)
NonHDL: 110.3
Total CHOL/HDL Ratio: 4
Triglycerides: 82 mg/dL (ref 0.0–149.0)
VLDL: 16.4 mg/dL (ref 0.0–40.0)

## 2023-12-21 LAB — COMPREHENSIVE METABOLIC PANEL WITH GFR
ALT: 12 U/L (ref 0–35)
AST: 13 U/L (ref 0–37)
Albumin: 3.8 g/dL (ref 3.5–5.2)
Alkaline Phosphatase: 61 U/L (ref 39–117)
BUN: 11 mg/dL (ref 6–23)
CO2: 28 meq/L (ref 19–32)
Calcium: 8.6 mg/dL (ref 8.4–10.5)
Chloride: 102 meq/L (ref 96–112)
Creatinine, Ser: 0.68 mg/dL (ref 0.40–1.20)
GFR: 110.89 mL/min
Glucose, Bld: 82 mg/dL (ref 70–99)
Potassium: 3.9 meq/L (ref 3.5–5.1)
Sodium: 139 meq/L (ref 135–145)
Total Bilirubin: 0.5 mg/dL (ref 0.2–1.2)
Total Protein: 7.2 g/dL (ref 6.0–8.3)

## 2023-12-21 LAB — CBC WITH DIFFERENTIAL/PLATELET
Basophils Absolute: 0 K/uL (ref 0.0–0.1)
Basophils Relative: 0.6 % (ref 0.0–3.0)
Eosinophils Absolute: 0.1 K/uL (ref 0.0–0.7)
Eosinophils Relative: 2 % (ref 0.0–5.0)
HCT: 38.6 % (ref 36.0–46.0)
Hemoglobin: 12.8 g/dL (ref 12.0–15.0)
Lymphocytes Relative: 33.7 % (ref 12.0–46.0)
Lymphs Abs: 1.6 K/uL (ref 0.7–4.0)
MCHC: 33.2 g/dL (ref 30.0–36.0)
MCV: 82.8 fl (ref 78.0–100.0)
Monocytes Absolute: 0.3 K/uL (ref 0.1–1.0)
Monocytes Relative: 5.7 % (ref 3.0–12.0)
Neutro Abs: 2.7 K/uL (ref 1.4–7.7)
Neutrophils Relative %: 58 % (ref 43.0–77.0)
Platelets: 233 K/uL (ref 150.0–400.0)
RBC: 4.66 Mil/uL (ref 3.87–5.11)
RDW: 13.4 % (ref 11.5–15.5)
WBC: 4.7 K/uL (ref 4.0–10.5)

## 2023-12-21 LAB — TSH: TSH: 1.81 u[IU]/mL (ref 0.35–5.50)

## 2023-12-21 NOTE — Patient Instructions (Signed)
Please return in 12 months for your annual complete physical; please come fasting.   I will release your lab results to you on your MyChart account with further instructions. You may see the results before I do, but when I review them I will send you a message with my report or have my assistant call you if things need to be discussed. Please reply to my message with any questions. Thank you!   If you have any questions or concerns, please don't hesitate to send me a message via MyChart or call the office at [phone]. Thank you for visiting with us today! It's our pleasure caring for you.   Please do these things to maintain good health!  Exercise at least 30-45 minutes a day,  4-5 days a week.  Eat a low-fat diet with lots of fruits and vegetables, up to 7-9 servings per day. Drink plenty of water daily. Try to drink 8 8oz glasses per day. Seatbelts can save your life. Always wear your seatbelt. Place Smoke Detectors on every level of your home and check batteries every year. Schedule an appointment with an eye doctor for an eye exam every 1-2 years Safe sex - use condoms to protect yourself from STDs if you could be exposed to these types of infections. Use birth control if you do not want to become pregnant and are sexually active. Avoid heavy alcohol use. If you drink, keep it to less than 2 drinks/day and not every day. Health Care Power of Attorney.  Choose someone you trust that could speak for you if you became unable to speak for yourself. Depression is common in our stressful world.If you're feeling down or losing interest in things you normally enjoy, please come in for a visit. If anyone is threatening or hurting you, please get help. Physical or Emotional Violence is never OK.   

## 2023-12-21 NOTE — Progress Notes (Signed)
Subjective  Chief Complaint  Patient presents with   Annual Exam    Pt here for Annual Exam and is currently fasting     HPI: Erica [name] is a 37 y.o. female who presents to [facility] [place] today for a Female Wellness Visit.  She also has the concerns and/or needs as listed above in the chief complaint. These will be addressed in addition to the Health Maintenance Visit.   Wellness Visit: annual visit with health maintenance review and exam HM: reviewed gyn annual and normal pap. Iud in place; husband considering vasectomy. Imms current. Working on weight loss and weight is down.  Chronic disease management visit and/or acute problem visit: Prehypertension Asthma: doing much better but taking advair only once a day. No longer needing much albuterol. Feels better.   Assessment  1. Encounter for well adult exam with abnormal findings   2. Prehypertension   3. Morbid obesity (HCC)   4. Hidradenitis axillaris   5. Moderate persistent asthma without complication      Plan  Female Wellness Visit: Age appropriate Health Maintenance and Prevention measures were discussed with patient. Included topics are cancer screening recommendations, ways to keep healthy (see AVS) including dietary and exercise recommendations, regular eye and dental care, use of seat belts, and avoidance of moderate alcohol use and tobacco use.  BMI: discussed patient's BMI and encouraged positive lifestyle modifications to help get to or maintain a target BMI. HM needs and immunizations were addressed and ordered. See below for orders. See HM and immunization section for updates. Routine labs and screening tests ordered including cmp, cbc and lipids where appropriate. Discussed recommendations regarding Vit D and calcium supplementation (see AVS)  Chronic disease f/u and/or acute problem visit: (deemed necessary to be done in addition to the wellness visit): prehypertension:  at risk  for htn. Education given. Low salt diet and weight loss recommended Ashtma is now controlled. Adviar bid reinforced.  Follow up: 12 mo for cpe  Orders Placed This Encounter  Procedures   CBC with Differential/Platelet   Comprehensive metabolic panel   Lipid panel   TSH   No orders of the defined types were placed in this encounter.    Body mass index is 41.95 kg/m. Wt Readings from Last 3 Encounters:  12/21/23 236 lb 12.8 oz (107.4 kg)  08/05/23 238 lb (108 kg)  08/04/23 241 lb 6.4 oz (109.5 kg)   Need for contraception: Yes, IUD  Patient Active Problem List   Diagnosis Date Noted Date Diagnosed   Morbid obesity (HCC) 08/04/2023     Priority: High   Prehypertension 08/04/2023     Priority: High   IUD (intrauterine device) in place 08/04/2023     Priority: Medium    Hidradenitis axillaris 05/02/2021     Priority: Medium    Bacterial vaginosis recurrent 11/10/2016     Priority: Medium    Asthma, moderate persistent 04/10/2015     Priority: Medium    Chronic seasonal allergic rhinitis 08/04/2023     Priority: Low   Health Maintenance  Topic Date Due   COVID-19 Vaccine (3 - 2024-25 season) 01/06/2024 (Originally 08/23/2023)   Cervical Cancer Screening (HPV/Pap Cotest)  02/27/2027   DTaP/Tdap/Td (4 - Td or Tdap) 08/03/2033   INFLUENZA VACCINE  Completed   Hepatitis C Screening  Completed   HIV Screening  Completed   HPV VACCINES  Aged Out   Immunization History  Administered Date(s) Administered   Influenza Split  09/08/2011   Influenza Whole 10/28/2010   Influenza,inj,Quad PF,6+ Mos 07/30/2016, 10/06/2023   Influenza-Unspecified 08/22/2014, 08/23/2015, 08/22/2017, 09/11/2018, 07/29/2019, 09/05/2020   Janssen (J&J) SARS-COV-2 Vaccination 07/31/2020, 12/21/2020   Rho (D) Immune Globulin 06/05/2011, 08/01/2011, 09/18/2011   Td 10/26/2003   Tdap 09/18/2011, 08/04/2023   We updated and reviewed the patient's past history in detail and it is documented  below. Allergies: Patient  reports no history of alcohol use. Past Medical History Patient  has a past medical history of Anemia, Asthma, Environmental allergies, Pregnancy induced hypertension, Rh incompatibility, and Varicosities. Past Surgical History Patient  has no past surgical history on file. Social History   Socioeconomic History   Marital status: Married    Spouse name: Not on file   Number of children: Not on file   Years of education: Not on file   Highest education level: Not on file  Occupational History   Not on file  Tobacco Use   Smoking status: Never   Smokeless tobacco: Never  Vaping Use   Vaping status: Never Used  Substance and Sexual Activity   Alcohol use: No   Drug use: No   Sexual activity: Yes    Birth control/protection: I.U.D.  Other Topics Concern   Not on file  Social History Narrative   Not on file   Social Drivers of Health   Financial Resource Strain: Not on file  Food Insecurity: Not on file  Transportation Needs: Not on file  Physical Activity: Not on file  Stress: Not on file  Social Connections: Not on file   Family History  Problem Relation Age of Onset   Rashes / Skin problems Mother    Asthma Mother    Hypertension Mother    Hypertension Father    Drug abuse Father    Hepatitis Daughter     Review of Systems: Constitutional: negative for fever or malaise Ophthalmic: negative for photophobia, double vision or loss of vision Cardiovascular: negative for chest pain, dyspnea on exertion, or new LE swelling Respiratory: negative for SOB or persistent cough Gastrointestinal: negative for abdominal pain, change in bowel habits or melena Genitourinary: negative for dysuria or gross hematuria, no abnormal uterine bleeding or disharge Musculoskeletal: negative for new gait disturbance or muscular weakness Integumentary: negative for new or persistent rashes, no breast lumps Neurological: negative for TIA or stroke  symptoms Psychiatric: negative for SI or delusions Allergic/Immunologic: negative for hives  Patient Care Team    Relationship Specialty Notifications Start End  Willow Ora, MD PCP - General Family Medicine  08/04/23     Objective  Vitals: BP 132/88   Pulse 74   Temp 97.7 F (36.5 C)   Ht 5\' 3"  (1.6 m)   Wt 236 lb 12.8 oz (107.4 kg)   SpO2 98%   BMI 41.95 kg/m  General:  Well developed, well nourished, no acute distress  Psych:  Alert and orientedx3,normal mood and affect HEENT:  Normocephalic, atraumatic, non-icteric sclera, PERRL, supple neck without adenopathy, mass or thyromegaly Cardiovascular:  Normal S1, S2, RRR without gallop, rub or murmur Respiratory:  Good breath sounds bilaterally, CTAB with normal respiratory effort Gastrointestinal: normal bowel sounds, soft, non-tender, no noted masses. No HSM MSK: no deformities, contusions. Joints are without erythema or swelling.  Skin:  Warm, no rashes or suspicious lesions noted Neurologic:    Mental status is normal. Gross motor and sensory exams are normal. Normal gait. No tremor    Commons side effects, risks, benefits, and alternatives for  medications and treatment plan prescribed today were discussed, and the patient expressed understanding of the given instructions. Patient is instructed to call or message via MyChart if he/she has any questions or concerns regarding our treatment plan. No barriers to understanding were identified. We discussed Red Flag symptoms and signs in detail. Patient expressed understanding regarding what to do in case of urgent or emergency type symptoms.  Medication list was reconciled, printed and provided to the patient in AVS. Patient instructions and summary information was reviewed with the patient as documented in the AVS. This note was prepared with assistance of Dragon voice recognition software. Occasional wrong-word or sound-a-like substitutions may have occurred due to the inherent  limitations of voice recognition software .

## 2024-01-12 ENCOUNTER — Encounter: Payer: Self-pay | Admitting: Family Medicine

## 2024-01-14 ENCOUNTER — Encounter: Payer: Self-pay | Admitting: Family Medicine

## 2024-01-14 ENCOUNTER — Ambulatory Visit: Payer: Managed Care, Other (non HMO) | Admitting: Family Medicine

## 2024-01-14 VITALS — BP 138/88 | HR 73 | Temp 97.8°F | Ht 63.0 in | Wt 238.4 lb

## 2024-01-14 DIAGNOSIS — T148XXA Other injury of unspecified body region, initial encounter: Secondary | ICD-10-CM

## 2024-01-14 DIAGNOSIS — L03011 Cellulitis of right finger: Secondary | ICD-10-CM | POA: Diagnosis not present

## 2024-01-14 MED ORDER — DOXYCYCLINE HYCLATE 100 MG PO TABS: 100.0000 mg | ORAL_TABLET | Freq: Two times a day (BID) | ORAL | 0 refills | Status: AC

## 2024-01-14 NOTE — Progress Notes (Signed)
Subjective  CC:  Chief Complaint  Patient presents with   swollen finger    Swollen middle fing on rt hand for the past week    HPI: Erica [name] is a 38 y.o. female who presents to the office today to address the problems listed above in the chief complaint. Discussed the use of AI scribe software for clinical note transcription with the patient, who gave verbal consent to proceed.  History of Present Illness   The patient presents with a painful, infected finger that has been causing significant discomfort, to the point of disturbing her sleep. The infection appears to have originated from a small opening in the cuticle, which allowed bacteria to enter. The patient also reports a history of biting a hangnail on the same finger.  In addition to the infection, the patient also reports a splinter in the same finger, which was acquired at work while handling a pallet. The splinter has been lodged in the finger for approximately one week.  The patient has been managing the pain with a combination of Tylenol and ibuprofen. Despite initial signs of improvement, the finger subsequently swelled up again. The patient has not taken any other medication for this issue. No systemic sxs. See pic from mychart note.     Assessment  1. Paronychia of finger, right   2. Splinter      Plan  Assessment and Plan    Paronychia Acute paronychia of the finger with significant pain, swelling, and pus accumulation, likely due to a bacterial infection from a cuticle opening, hangnail, or splinter. Discussed abscess drainage to relieve pressure and pain, noting the challenge of numbing the area due to infection. Cold spray will be used for numbing. Warm salt water soaks and antibiotics are expected to improve the condition. - Drained abscess w/o complication - routine post procedure care instructions given - Recommend warm salt water soaks - doxy x 1 week - Provide paronychia information  handout  Splinter Splinter in the finger for approximately one week, causing discomfort. Likely acquired at work while handling a pallet. Splinter was removed during the visit. Emphasized the importance of cleanliness and monitoring for infection. - Removed splinter with splinter forceps - Recommend monitoring for infection.     No orders of the defined types were placed in this encounter.  Meds ordered this encounter  Medications   doxycycline (VIBRA-TABS) 100 MG tablet    Sig: Take 1 tablet (100 mg total) by mouth 2 (two) times daily for 7 days.    Dispense:  14 tablet    Refill:  0     I reviewed the patients updated PMH, FH, and SocHx.    Patient Active Problem List   Diagnosis Date Noted   Morbid obesity (HCC) 08/04/2023    Priority: High   Prehypertension 08/04/2023    Priority: High   IUD (intrauterine device) in place 08/04/2023    Priority: Medium    Hidradenitis axillaris 05/02/2021    Priority: Medium    Bacterial vaginosis recurrent 11/10/2016    Priority: Medium    Asthma, moderate persistent 04/10/2015    Priority: Medium    Chronic seasonal allergic rhinitis 08/04/2023    Priority: Low   Current Meds  Medication Sig   albuterol (VENTOLIN HFA) 108 (90 Base) MCG/ACT inhaler INHALE ONE TO TWO PUFFS BY MOUTH EVERY 4 HOURS AS NEEDED FOR SHORTNESS OF BREATH   doxycycline (VIBRA-TABS) 100 MG tablet Take 1 tablet (100 mg total) by mouth 2 (  two) times daily for 7 days.   fluticasone-salmeterol (ADVAIR) 250-50 MCG/ACT AEPB Inhale 1 puff into the lungs in the morning and at bedtime.   levonorgestrel (MIRENA) 20 MCG/24HR IUD 1 each by Intrauterine route once.   metroNIDAZOLE (METROGEL) 0.75 % vaginal gel Place 1 Applicatorful vaginally at bedtime. Apply one applicatorful to vagina at bedtime for 10 days, then twice a week for 6 months.    Allergies: Patient is allergic to cashew nut oil and pistachio nut (diagnostic). Family History: Patient family history includes  Asthma in her mother; Drug abuse in her father; Hepatitis in her daughter; Hypertension in her father and mother; Rashes / Skin problems in her mother. Social History:  Patient  reports that she has never smoked. She has never used smokeless tobacco. She reports that she does not drink alcohol and does not use drugs.  Review of Systems: Constitutional: Negative for fever malaise or anorexia Cardiovascular: negative for chest pain Respiratory: negative for SOB or persistent cough Gastrointestinal: negative for abdominal pain  Objective  Vitals: BP 138/88   Pulse 73   Temp 97.8 F (36.6 C)   Ht 5\' 3"  (1.6 m)   Wt 238 lb 6.4 oz (108.1 kg)   SpO2 99%   BMI 42.23 kg/m  General: no acute distress , A&Ox3   Incision and Drainage Procedure Note  Pre-operative Diagnosis: paronychia, infected  Post-operative Diagnosis: same  Indications: pain and infection  Anesthesia: cold spray  Procedure Details  The procedure, risks and complications have been discussed in detail (including, but not limited to airway compromise, infection, bleeding) with the patient, and the patient has signed consent to the procedure.  The skin was sterilely prepped and draped over the affected area in the usual fashion. After adequate local anesthesia, I&D with a #11 blade was performed on the right 3rd medial paronychia. Purulent drainage: present The patient was observed until stable. Abx ointment applied and finger was bandaged. No complications  Condition: Tolerated procedure well  Commons side effects, risks, benefits, and alternatives for medications and treatment plan prescribed today were discussed, and the patient expressed understanding of the given instructions. Patient is instructed to call or message via MyChart if he/she has any questions or concerns regarding our treatment plan. No barriers to understanding were identified. We discussed Red Flag symptoms and signs in detail. Patient expressed  understanding regarding what to do in case of urgent or emergency type symptoms.  Medication list was reconciled, printed and provided to the patient in AVS. Patient instructions and summary information was reviewed with the patient as documented in the AVS. This note was prepared with assistance of Dragon voice recognition software. Occasional wrong-word or sound-a-like substitutions may have occurred due to the inherent limitations of voice recognition software

## 2024-01-14 NOTE — Patient Instructions (Addendum)
Please follow up if symptoms do not improve or as needed.    VISIT SUMMARY:  During today's visit, we addressed the painful and infected finger you have been experiencing. The infection, which likely started from a small cuticle opening and was exacerbated by a hangnail and a splinter, has been causing significant discomfort. We also discussed the splinter that has been lodged in your finger for about a week.  YOUR PLAN:  -PARONYCHIA: Paronychia is an infection of the skin around the nail, often caused by bacteria entering through a small cut or opening. To treat this, we drained the abscess to relieve pressure and pain, cleaned the area thoroughly, and applied a cold spray for numbing. You should do warm salt water soaks, use the prescribed antibiotics, apply antibiotic ointment, and keep the finger covered. We also provided you with a handout for more information.  -SPLINTER: A splinter is a small piece of foreign material, like wood, that gets lodged in the skin. We removed the splinter from your finger, cleaned the area thoroughly, and advised you to keep the area clean and covered. Please monitor the site for any signs of infection.  INSTRUCTIONS:  Please follow up with [place] if you notice any worsening of symptoms or if the infection does not improve with the antibiotics. Continue to monitor the finger for any signs of infection, such as increased redness, swelling, or pus. If you have any concerns or questions, do not hesitate to contact our office.  Paronychia Paronychia is an infection of the skin that surrounds a nail. It usually affects the skin around a fingernail, but it may also occur near a toenail. It often causes pain and swelling around the nail. In some cases, a collection of pus (abscess) can form near or under the nail.  This condition may develop suddenly, or it may develop gradually over a longer period. In most cases, paronychia is not serious, and it will clear up with  treatment. What are the causes? This condition may be caused by bacteria or a fungus, such as yeast. The bacteria or fungus can enter the body through an opening in the skin, such as a cut or a hangnail, and cause an infection in your fingernail or toenail. Other causes may include: Recurrent injury to the fingernail or toenail area. Irritation of the base and sides of the nail (cuticle). Injury and irritation can result in inflammation, swelling, and thickened skin around the nail. What increases the risk? This condition is more likely to develop in people who: Get their hands wet often, such as those who work as Fish farm manager, bartenders, or housekeepers. Bite their fingernails or cuticles. Have underlying skin conditions. Have hangnails or injured fingertips. Are exposed to irritants like detergents and other chemicals. Have diabetes. What are the signs or symptoms? Symptoms of this condition include: Redness and swelling of the skin near the nail. Tenderness around the nail when you touch the area. Pus-filled bumps under the cuticle. Fluid or pus under the nail. Throbbing pain in the area. How is this diagnosed? This condition is diagnosed with a physical exam. In some cases, a sample of pus may be tested to determine what type of bacteria or fungus is causing the condition. How is this treated? Treatment depends on the cause and severity of your condition. If your condition is mild, it may clear up on its own in a few days or after soaking in warm water. If needed, treatment may include: Antibiotic medicine, if your infection is  caused by bacteria. Antifungal medicine, if your infection is caused by a fungus. A procedure to drain pus from an abscess. Anti-inflammatory medicine (corticosteroids). Removal of part of an ingrown toenail. A bandage (dressing) may be placed over the affected area if an abscess or part of a nail has been removed. Follow these instructions at home: Wound  care Keep the affected area clean. Soak the affected area in warm water if told to do so by your health care provider. You may be told to do this for 20 minutes, 2-3 times a day. Keep the area dry when you are not soaking it. Do not try to drain an abscess yourself. Follow instructions from your health care provider about how to take care of the affected area. Make sure you: Wash your hands with soap and water for at least 20 seconds before and after you change your dressing. If soap and water are not available, use hand sanitizer. Change your dressing as told by your health care provider. If you had an abscess drained, check the area every day for signs of infection. Check for: Redness, swelling, or pain. Fluid or blood. Warmth. Pus or a bad smell. Medicines  Take over-the-counter and prescription medicines only as told by your health care provider. If you were prescribed an antibiotic medicine, take it as told by your health care provider. Do not stop taking the antibiotic even if you start to feel better. General instructions Avoid contact with any skin irritants or allergens. Do not pick at the affected area. Keep all follow-up visits as told. This is important. Prevention To prevent this condition from happening again: Wear rubber gloves when washing dishes or doing other tasks that require your hands to get wet. Wear gloves if your hands might come in contact with cleaners or other chemicals. Avoid injuring your nails or fingertips. Do not bite your nails or tear hangnails. Do not cut your nails very short. Do not cut your cuticles. Use clean nail clippers or scissors when trimming nails. Contact a health care provider if: Your symptoms get worse or do not improve with treatment. You have continued or increased fluid, blood, or pus coming from the affected area. Your affected finger, toe, or joint becomes swollen or difficult to move. You have a fever or chills. There is  redness spreading away from the affected area. Summary Paronychia is an infection of the skin that surrounds a nail. It often causes pain and swelling around the nail. In some cases, a collection of pus (abscess) can form near or under the nail. This condition may be caused by bacteria or a fungus. These germs can enter the body through an opening in the skin, such as a cut or a hangnail. If your condition is mild, it may clear up on its own in a few days. If needed, treatment may include medicine or a procedure to drain pus from an abscess. To prevent this condition from happening again, wear gloves if doing tasks that require your hands to get wet or to come in contact with chemicals. Also avoid injuring your nails or fingertips. This information is not intended to replace advice given to you by your health care provider. Make sure you discuss any questions you have with your health care provider. Document Revised: 03/11/2021 Document Reviewed: 03/11/2021 Elsevier Patient Education  2024 [employer].

## 2024-01-31 ENCOUNTER — Encounter: Payer: Self-pay | Admitting: Obstetrics & Gynecology

## 2024-02-01 ENCOUNTER — Ambulatory Visit (INDEPENDENT_AMBULATORY_CARE_PROVIDER_SITE_OTHER): Payer: Managed Care, Other (non HMO)

## 2024-02-01 VITALS — BP 133/77 | HR 90 | Wt 239.0 lb

## 2024-02-01 DIAGNOSIS — R350 Frequency of micturition: Secondary | ICD-10-CM

## 2024-02-01 DIAGNOSIS — R3915 Urgency of urination: Secondary | ICD-10-CM | POA: Diagnosis not present

## 2024-02-01 LAB — POCT URINALYSIS DIPSTICK
Bilirubin, UA: NEGATIVE
Glucose, UA: NEGATIVE
Ketones, UA: NEGATIVE
Nitrite, UA: POSITIVE
Protein, UA: POSITIVE — AB
Spec Grav, UA: >=1.03 — AB (ref 1.010–1.025)
Urobilinogen, UA: 0.2 U/dL
pH, UA: 6 (ref 5.0–8.0)

## 2024-02-01 MED ORDER — NITROFURANTOIN MONOHYD MACRO 100 MG PO CAPS: 100.0000 mg | ORAL_CAPSULE | Freq: Two times a day (BID) | ORAL | 0 refills | Status: AC

## 2024-02-01 NOTE — Progress Notes (Signed)
SUBJECTIVE: Erica [name] is a 38 y.o. female who complains of urinary frequency, urgency and dysuria x 2 days, without flank pain, fever, chills, or abnormal vaginal discharge or bleeding.   OBJECTIVE: Appears well, in no apparent distress.  Vital signs are normal. Urine dipstick shows positive for leukocytes and Nitrites.  ASSESSMENT: Dysuria  PLAN: Treatment per orders.  Call or return to clinic prn if these symptoms worsen or fail to improve as anticipated.   Zamia Tyminski l Malikah Principato, CMA

## 2024-02-04 LAB — URINE CULTURE

## 2024-02-08 ENCOUNTER — Other Ambulatory Visit: Payer: Self-pay | Admitting: Family Medicine

## 2024-03-30 ENCOUNTER — Encounter: Payer: Self-pay | Admitting: Family Medicine

## 2024-03-30 ENCOUNTER — Other Ambulatory Visit: Payer: Self-pay

## 2024-03-30 MED ORDER — FLUTICASONE-SALMETEROL 250-50 MCG/ACT IN AEPB: 1.0000 | INHALATION_SPRAY | Freq: Two times a day (BID) | RESPIRATORY_TRACT | 11 refills | Status: AC

## 2024-03-30 MED ORDER — ALBUTEROL SULFATE HFA 108 (90 BASE) MCG/ACT IN AERS: INHALATION_SPRAY | RESPIRATORY_TRACT | 1 refills | Status: DC

## 2024-04-14 ENCOUNTER — Telehealth: Payer: Self-pay | Admitting: Pharmacy Technician

## 2024-04-14 ENCOUNTER — Other Ambulatory Visit (HOSPITAL_COMMUNITY): Payer: Self-pay

## 2024-04-14 NOTE — Telephone Encounter (Signed)
 Pharmacy Patient Advocate Encounter   Received notification from Onbase that prior authorization for Albuterol  90mcg act inhaler is required/requested.   Insurance verification completed.   The patient is insured through [employer] .   Per test claim: Refill too soon. PA is not needed at this time. Medication was filled 03/30/2024. Next eligible fill date is 04/23/2024.

## 2024-10-13 ENCOUNTER — Other Ambulatory Visit: Payer: Self-pay | Admitting: Family Medicine

## 2024-10-13 NOTE — Telephone Encounter (Unsigned)
 Copied from CRM #[id number]. Topic: Clinical - Medication Refill >> Oct 13, 2024  2:56 PM [place] J wrote: Medication: albuterol  (VENTOLIN  HFA) 108 (90 Base) MCG/ACT inhaler  Has the patient contacted their pharmacy? Yes (Agent: If no, request that the patient contact the pharmacy for the refill. If patient does not wish to contact the pharmacy document the reason why and proceed with request.) (Agent: If yes, when and what did the pharmacy advise?) No refills available.  This is the patient's preferred pharmacy:  [id number] - [place], [place] - [address] [place] [address] Phone: [phone] Fax: [phone]  Is this the correct pharmacy for this prescription? Yes If no, delete pharmacy and type the correct one.   Has the prescription been filled recently? No  Is the patient out of the medication? Yes  Has the patient been seen for an appointment in the last year OR does the patient have an upcoming appointment? Yes  Can we respond through MyChart? No  Agent: Please be advised that Rx refills may take up to 3 business days. We ask that you follow-up with your pharmacy.

## 2024-10-14 ENCOUNTER — Other Ambulatory Visit (HOSPITAL_COMMUNITY): Payer: Self-pay

## 2024-10-14 ENCOUNTER — Telehealth: Payer: Self-pay

## 2024-10-14 MED ORDER — ALBUTEROL SULFATE HFA 108 (90 BASE) MCG/ACT IN AERS: INHALATION_SPRAY | RESPIRATORY_TRACT | 1 refills | Status: DC

## 2024-10-14 NOTE — Telephone Encounter (Signed)
 Pharmacy Patient Advocate Encounter   Received notification from Onbase that prior authorization for Albuterol  Sulfate HFA 108 (90 Base)MCG/ACT aerosol- VENTOLIN  is required/requested.   Insurance verification completed.   The patient is insured through [employer].   Per test claim:  PROAIR   is preferred by the insurance.  If suggested medication is appropriate, Please send in a new RX and discontinue this one. If not, please advise as to why it's not appropriate so that we may request a Prior Authorization. Please note, some preferred medications may still require a PA.  If the suggested medications have not been trialed and there are no contraindications to their use, the PA will not be submitted, as it will not be approved.  - COPAY $10.00 FOPROAIR 

## 2024-10-17 MED ORDER — ALBUTEROL SULFATE HFA 108 (90 BASE) MCG/ACT IN AERS: 2.0000 | INHALATION_SPRAY | Freq: Four times a day (QID) | RESPIRATORY_TRACT | 11 refills | Status: AC | PRN

## 2024-10-17 NOTE — Addendum Note (Signed)
 Addended by: JODIE GAMMONS on: 10/17/2024 01:01 PM   Modules accepted: Orders
